# Patient Record
Sex: Female | Born: 1970 | Hispanic: No | Marital: Married | Smoking: Never smoker
Health system: Southern US, Community
[De-identification: ages and names within clinical notes are randomized; demographics above are authoritative.]

## PROBLEM LIST (undated history)

## (undated) HISTORY — PX: NO PAST SURGERIES: SHX2092

---

## 2000-08-19 ENCOUNTER — Emergency Department (HOSPITAL_COMMUNITY): Admission: EM | Admit: 2000-08-19 | Discharge: 2000-08-19 | Payer: Self-pay | Admitting: Emergency Medicine

## 2001-03-12 ENCOUNTER — Other Ambulatory Visit: Admission: RE | Admit: 2001-03-12 | Discharge: 2001-03-12 | Payer: Self-pay | Admitting: Obstetrics and Gynecology

## 2001-08-04 ENCOUNTER — Inpatient Hospital Stay (HOSPITAL_COMMUNITY): Admission: AD | Admit: 2001-08-04 | Discharge: 2001-08-06 | Payer: Self-pay | Admitting: Obstetrics and Gynecology

## 2004-04-10 ENCOUNTER — Other Ambulatory Visit: Admission: RE | Admit: 2004-04-10 | Discharge: 2004-04-10 | Payer: Self-pay | Admitting: Obstetrics and Gynecology

## 2004-07-03 ENCOUNTER — Ambulatory Visit (HOSPITAL_COMMUNITY): Admission: RE | Admit: 2004-07-03 | Discharge: 2004-07-03 | Payer: Self-pay | Admitting: Internal Medicine

## 2004-10-03 ENCOUNTER — Ambulatory Visit (HOSPITAL_COMMUNITY): Admission: RE | Admit: 2004-10-03 | Discharge: 2004-10-03 | Payer: Self-pay | Admitting: Obstetrics and Gynecology

## 2005-02-22 ENCOUNTER — Inpatient Hospital Stay (HOSPITAL_COMMUNITY): Admission: AD | Admit: 2005-02-22 | Discharge: 2005-02-25 | Payer: Self-pay | Admitting: Obstetrics and Gynecology

## 2006-04-03 ENCOUNTER — Other Ambulatory Visit: Admission: RE | Admit: 2006-04-03 | Discharge: 2006-04-03 | Payer: Self-pay | Admitting: Obstetrics and Gynecology

## 2007-12-22 ENCOUNTER — Ambulatory Visit (HOSPITAL_COMMUNITY): Admission: RE | Admit: 2007-12-22 | Discharge: 2007-12-22 | Payer: Self-pay | Admitting: Obstetrics and Gynecology

## 2009-01-06 ENCOUNTER — Ambulatory Visit (HOSPITAL_COMMUNITY): Admission: RE | Admit: 2009-01-06 | Discharge: 2009-01-06 | Payer: Self-pay | Admitting: Obstetrics and Gynecology

## 2009-01-24 ENCOUNTER — Encounter: Admission: RE | Admit: 2009-01-24 | Discharge: 2009-01-24 | Payer: Self-pay | Admitting: Obstetrics and Gynecology

## 2009-10-13 ENCOUNTER — Ambulatory Visit (HOSPITAL_BASED_OUTPATIENT_CLINIC_OR_DEPARTMENT_OTHER): Admission: RE | Admit: 2009-10-13 | Discharge: 2009-10-13 | Payer: Self-pay | Admitting: Internal Medicine

## 2009-10-13 ENCOUNTER — Ambulatory Visit: Payer: Self-pay | Admitting: Radiology

## 2010-12-09 ENCOUNTER — Encounter: Payer: Self-pay | Admitting: Obstetrics and Gynecology

## 2011-03-20 ENCOUNTER — Other Ambulatory Visit (HOSPITAL_COMMUNITY): Payer: Self-pay | Admitting: Obstetrics and Gynecology

## 2011-03-20 DIAGNOSIS — Z1231 Encounter for screening mammogram for malignant neoplasm of breast: Secondary | ICD-10-CM

## 2011-03-29 ENCOUNTER — Ambulatory Visit (HOSPITAL_COMMUNITY)
Admission: RE | Admit: 2011-03-29 | Discharge: 2011-03-29 | Disposition: A | Discharge status: Home / Self Care | Payer: 59 | Source: Ambulatory Visit | Attending: Obstetrics and Gynecology | Admitting: Obstetrics and Gynecology

## 2011-03-29 DIAGNOSIS — Z1231 Encounter for screening mammogram for malignant neoplasm of breast: Secondary | ICD-10-CM | POA: Insufficient documentation

## 2011-04-05 NOTE — H&P (Signed)
NAMESHONIA, SKILLING            ACCOUNT NO.:  [id number]   MEDICAL RECORD NO.:  [id number]          PATIENT TYPE:  INP   LOCATION:  9173                          FACILITY:  [facility]   PHYSICIAN:  Crist Fat. Rivard, M.D. DATE OF BIRTH:  Aug 09, 1971   DATE OF ADMISSION:  02/22/2005  DATE OF DISCHARGE:                                HISTORY & PHYSICAL   HISTORY OF PRESENT ILLNESS:  Ms. Leslie Kirby is a 40 year old married African  American female gravida 3, para 2-0-0-2 at 38-4/7 weeks who presents  complaining of uterine contractions since about 4 p.m. but with increased  frequency since midnight tonight.  She denies any gushes of fluid but has  seen some bloody show.  She denies any nausea, vomiting, headaches, or  visual disturbances.  Her pregnancy has been followed at [facility]  [facility] by the Certified Nurse Midwife service and has been essentially  uncomplicated though at risk for advanced maternal age, declining  amniocentesis.  She has also recently been noted to be size greater than  dates and had an ultrasound scheduled for later this week but has now  progressed in labor prior to that time.   PAST OB/GYN HISTORY:  She is a gravida 3, para 2-0-0-2 with an LMP of May 29, 2004, given her an [facility] of March 04, 2005, confirmed by ultrasound.  She  has delivered a viable female infant in October of 1996 who weighed 8 pounds  at [redacted] weeks gestation following a 12-hour labor.  She was delivered in  __________ with no complications.  That child's name is Pieter Partridge.  In  September of 2002, she delivered a viable female infant who weighed 8 pounds 6  ounces at [redacted] weeks gestation following a 14-hour labor with an epidural.  That child's name is Clovis Riley and was delivered at [facility] attended  by the [facility].   ALLERGIES:  NO KNOWN DRUG ALLERGIES.   PAST MEDICAL HISTORY:  She reports having the usual childhood diseases.  She  has no medical problems, and her only  hospitalization has been for  childbirth.   FAMILY HISTORY:  Noncontributory, except for maternal grandmother deceased  from MI.  Genetic history is negative except for the fact that the patient  is over age 70 and declined amniocentesis.   SOCIAL HISTORY:  She is married to [place]ley who is involved and supportive.  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  He used to be  employed at [employer] food services.  He is now employed at [employer] endoscopy.  They are of the [place] faith.  They deny any  elicit drug use, alcohol, or smoking with this pregnancy.   PRENATAL LABORATORY DATA:  Blood type O positive, antibody screen negative,  sickle cell trait negative, syphilis nonreactive, rubella positive,  hepatitis B surface antigen negative, GC and Chlamydia negative, Pap within  normal limits.  Her one-hour Glucola was 120.  Her 36-week beta strep was  negative.   PHYSICAL EXAMINATION:  VITAL SIGNS:  Her vital signs were stable.  She is  afebrile.  HEENT:  Grossly within normal limits.  HEART:  Regular rate and rhythm.  CHEST:  Clear.  BREASTS:  Soft and nontender.  ABDOMEN:  Gravid with uterine contractions every four to six minutes.  Her  fetal heart rate is reactive and reassuring.  PELVIC:  4 cm, 90%, vertex at -2, with intact membranes.  EXTREMITIES:  Within normal limits.   ASSESSMENT:  1.  Intrauterine pregnancy at term.  2.  Early active labor.  3.  Negative group B strep.   PLAN:  Admit to labor and delivery to follow routine CNM orders and to  notify Dr. Estanislado Pandy of the patient's admission.      SJD/MEDQ  D:  02/22/2005  T:  02/22/2005  Job:  [id number]

## 2011-04-05 NOTE — H&P (Signed)
[facility]  Patient:    Leslie Kirby, Leslie Kirby Visit Number: [id number] MRN: [id number]          Service Type: OBS Location: 910B 9166 [id number] Attending Physician:  Leonard Schwartz Dictated by:   Marcelle Smiling Clelia Croft, C.N.M. Admit Date:  08/04/2001                           History and Physical  DATE OF BIRTH:                1971-01-26.  HISTORY OF PRESENT ILLNESS:   Leslie Kirby is a 40 year old, gravida 2, para 1-0-0-1, married black female at 38-4/7 weeks who presents with regular uterine contractions since 3 p.m. She denies leaking, bleeding, headache, or visual disturbances, although she has had bloody show this evening. Her pregnancy has been followed by the [facility] Certified Nurse Midwife service and has been remarkable for (1) late to care at 18 weeks, and (2) group B strep negative.  PRENATAL LABORATORY DATA:     Her prenatal labs were collected on March 12, 2001 with hemoglobin 11.4, hematocrit 36.4, platelets 275,000. Blood type O positive, antibody negative, sickle cell trait negative, RPR nonreactive, rubella immune, hepatitis B surface antigen negative. Pap smear within normal limits. On May 03, 2001 her one-hour glucola was 83 and her hemoglobin was 13. On July 21, 2001 culture of the vaginal tract for group B strep was negative.  HISTORY OF PRESENT PREGNANCY: She presented for care at approximately [redacted] weeks gestation. Pregnancy ultrasonography showed growth consistent with dating by last menstrual period. She had some sciatic pain at approximately 26 weeks which lasted approximately three weeks. The rest of her prenatal care was unremarkable.  OBSTETRIC HISTORY:            She is a gravida 2, para 1-0-0-1. In October of 1996 she vaginally delivered a female infant in [place] after five hours in labor at term. Infant was approximately 7 pounds. The present pregnancy is her second pregnancy.  PAST MEDICAL  HISTORY:  ALLERGIES:                     No known drug allergies.  MEDICAL HISTORY:              The patient reports having had the usual childhood illnesses. She has used Depo-Provera and oral contraceptives in the past for birth control.  FAMILY HISTORY:               Significant for maternal grandmother being deceased.  GENETIC HISTORY:              Negative.  SOCIAL HISTORY:               She is married to [place] who is involved and supportive. They are of the [place] faith and both are employed. They deny any alcohol, tobacco, or illicit drug use with the pregnancy.  OBJECTIVE DATA:  VITAL SIGNS:                  Vitals signs are stable. She is afebrile.  HEENT:                        Within normal limits.  CHEST:                        Clear to auscultation.  HEART:                        Regular to rate and rhythm.  ABDOMEN:                      Gravid in contour with fundal height approximately 38 cm above the pubic symphysis. Uterine contractions are approximately every three to five minutes and they are strong.  ELECTRONIC FETAL MONITOR:     Fetal heart rate is reassuring with positive accelerations with no decelerations.  PELVIC:                       Cervix is 3-4 cm, 100%, vertex, -2 with a bulging bag of water.  EXTREMITIES:                  Within normal limits.  ASSESSMENT:                   1. Intrauterine pregnancy at term.                               2. Early active labor.   PLAN:                         1. Admit to birthing suite per consult with                                  Dr. Stefano Gaul.                               2. Routine CNM orders.                               3. Plans IV pain medications for relief.                               4. Anticipate normal spontaneous vaginal birth. Dictated by:   Marcelle Smiling Clelia Croft, C.N.M. Attending Physician:  Leonard Schwartz DD:  08/04/01 TD:  08/04/01 Job: [id number]

## 2011-04-05 NOTE — Discharge Summary (Signed)
NAMETANVEER, DOBBERSTEIN            ACCOUNT NO.:  [id number]   MEDICAL RECORD NO.:  [id number]          PATIENT TYPE:  INP   LOCATION:  9141                          [facility]ILITY:  [facility]   PHYSICIAN:  Janine Limbo, M.D.DATE OF BIRTH:  [birth year]   DATE OF ADMISSION:  02/22/2005  DATE OF DISCHARGE:  02/25/2005                                 DISCHARGE SUMMARY   ADMISSION DIAGNOSES:  1.  Intrauterine pregnancy at term.  2.  Advanced maternal age.  3.  Early active labor.  4.  Negative group B Strep.   DISCHARGE DIAGNOSES:  1.  Intrauterine pregnancy at term.  2.  Advanced maternal age.  3.  Early active labor.  4.  Negative group B Strep.  5.  Failure to progress in labor.  6.  Persistent occiput posterior presentation.  7.  Status post primary low transverse cesarean section.  8.  Breast feeding.  9.  Planning vasectomy for contraception.   PROCEDURES:  Primary low transverse cesarean section for delivery of a  viable female infant who weighed 8 pounds 6 ounces and had Apgars of 8 and 9.  Attended in delivery by Dr. Hal Morales and Renaldo Reel. Emilee Hero, C.N.M.   HOSPITAL COURSE:  Ms. Long is a 40 year old married African female gravida  3, para 2-0-0-2 at 38-4/7 weeks who presented complaining of uterine  contractions and was, on admission, 4 cm, 90%, vertex -2 with intact  membranes.  She progressed in labor to approximately 8 cm prior to  artificial rupture of membranes whereupon the vertex did descend well  against the cervix, however, she failed to dilate any further and actually  regressed to about 6 to 7 cm even with greater than 250 MVU for more than  two hours and at that point was recommended to proceed with the cesarean  section for failure to progress.  The patient and husband agreed and  underwent the same for delivery of a viable female infant who had Apgars of 8  and 9 and weight 8 pounds 6 ounces and was noted to be in a persistent OP  presentation at delivery.   She was attended in delivery by Dr. Hal Morales and Renaldo Reel. Emilee Hero.  Please see operative note for further details.   Postoperatively, she has done well.  She is ambulating, voiding and eating  without difficulty.  Her vital signs are stable and she has been afebrile  throughout her postoperative stay.  She is breastfeeding without difficulty.  She is deemed ready for discharge today.   DISCHARGE INSTRUCTIONS:  Her discharge instructions are as per the [facility] handout.   DISCHARGE MEDICATIONS:  1.  Motrin 600 mg p.o. q.6h. p.r.n. for pain.  2.  Tylox one to two p.o. q.4-6h. p.r.n. for pain.   DISCHARGE LABORATORY DATA:  Hemoglobin is 8.9, wbc count is 21.4 and her  platelets are 208.   FOLLOW UP:  Her discharge follow-up will be in four to six weeks at [facility] or p.r.n.   DISCHARGE STATUS:  Well and stable.  SJD/MEDQ  D:  02/25/2005  T:  02/25/2005  Job:  [id number]

## 2011-04-05 NOTE — Op Note (Signed)
Leslie Kirby, Leslie Kirby            ACCOUNT NO.:  [id number]   MEDICAL RECORD NO.:  [id number]          PATIENT TYPE:  INP   LOCATION:  9141                          [facility]  [facility]   PHYSICIAN:  Hal Morales, M.D.DATE OF BIRTH:  [birth year]   DATE OF PROCEDURE:  02/22/2005  DATE OF DISCHARGE:                                 OPERATIVE REPORT   PREOPERATIVE DIAGNOSES:  1.  Intrauterine pregnancy at term.  2.  Failure to progress in labor.   POSTOPERATIVE DIAGNOSIS:  1.  Intrauterine pregnancy at term.  2.  Failure to progress in labor.  3.  Occiput posterior.   OPERATION:  Primary low transverse cesarean section.   SURGEON:  Vanessa P. Pennie Rushing, M.D.   FIRST ASSISTANT:  Erin Sons, certified nurse midwife.   ANESTHESIA:  Epidural.   ESTIMATED BLOOD LOSS:  1200 mL.   COMPLICATIONS:  Poor Foley drainage during the procedure.   FINDINGS:  The patient was delivered of a female infant weighing 8 pounds 6  ounces with Apgars of 8 and 9 at one and five minutes respectively. The  uterus, tubes and ovaries were normal for the gravid state.   PROCEDURE:  A discussion was held with the patient and her husband once her  cervical examination was noted to be unchanged after approximately 4 hours  of [place] units in the 220-280 range. In light of failure to progress,  the recommendation was made with cesarean section. The risks of anesthesia,  bleeding, infection and damage to adjacent organs was explained and the  patient acknowledged understanding of these risks. The patient was thus  taken to the operating room after appropriate identification and placed on  the operating table. Her Foley catheter and labor epidural were in place.  She was placed in the supine position with a left lateral tilt as the labor  epidural was dosed for surgical anesthesia. The abdomen was prepped with  multiple layers of Betadine and draped as a sterile field. The suprapubic  region was infiltrated  with 20 mL  of 0.25% Marcaine once adequate  anesthesia was documented. A transverse incision was made in the abdomen  suprapubically in the abdomen opened in layers. The peritoneum was entered  and the bladder blade placed. The uterus was incised approximately 2 cm  above the uterovesical fold and that incision taken laterally on either side  bluntly. The infant was delivered from the occiput posterior position and  after having the nares and pharynx suctioned and the cord clamped and cut  was handed off to the awaiting pediatricians. The appropriate cord blood was  drawn and the placenta noted to have separated from the uterus and was  removed from the operative field. The uterine incision was closed with a  running interlocking suture of zero Vicryl. An imbricating suture of zero  Vicryl was placed. Bovie cautery allowed for adequate hemostasis. Copious  irrigation was carried out. The abdominal peritoneum was closed with a  running suture of 2-0 Vicryl. The rectus muscles were reapproximated in the  midline with figure-of-eight suture of zero Vicryl. The rectus fascia was  closed with  a running suture of zero Vicryl and then reinforced on either  side of midline with figure-of-eight sutures of zero Vicryl. The  subcutaneous tissue was irrigated and made hemostatic with Bovie cautery.  Skin staples were applied to the skin incision. A sterile dressing was  applied. The patient was taken from the operating room to the recovery room  in satisfactory condition having tolerated the procedure well with sponge  and instrument counts correct. The infant went to the full-term nursery. The  Foley catheter was noted to drain well once she was in the postoperative  anesthesia care unit.      VPH/MEDQ  D:  02/22/2005  T:  02/23/2005  Job:  [id number]

## 2012-03-19 ENCOUNTER — Ambulatory Visit: Payer: Self-pay | Admitting: Obstetrics and Gynecology

## 2014-10-21 ENCOUNTER — Ambulatory Visit (INDEPENDENT_AMBULATORY_CARE_PROVIDER_SITE_OTHER): Payer: 59 | Admitting: Diagnostic Neuroimaging

## 2014-10-21 ENCOUNTER — Encounter: Payer: Self-pay | Admitting: Diagnostic Neuroimaging

## 2014-10-21 VITALS — BP 128/77 | HR 68 | Temp 98.2°F | Ht 64.0 in | Wt 216.2 lb

## 2014-10-21 DIAGNOSIS — H538 Other visual disturbances: Secondary | ICD-10-CM

## 2014-10-21 DIAGNOSIS — G441 Vascular headache, not elsewhere classified: Secondary | ICD-10-CM

## 2014-10-21 DIAGNOSIS — G43009 Migraine without aura, not intractable, without status migrainosus: Secondary | ICD-10-CM

## 2014-10-21 NOTE — Patient Instructions (Signed)
I will check MRI brain.  

## 2014-10-21 NOTE — Progress Notes (Signed)
[facility]  PATIENT: Leslie Kirby DOB: 08/04/1971  REFERRING CLINICIAN: Osei-Bonsu HISTORY FROM: patient  REASON FOR VISIT: new consult    HISTORICAL  CHIEF COMPLAINT:  Chief Complaint  Patient presents with  . Headache    HISTORY OF PRESENT ILLNESS:   43 year old right-handed female here for evaluation of headaches. For past 1-2 years patient has had intermittent headaches, right-sided, throbbing, pounding headaches without nausea vomiting photophobia or phonophobia. Patient usually takes Advil and goes to sleep. No specific triggering factors. Patient has 2-3 days of headache per month. No change in sleep, stress, diet, exercise, hormonal association. Patient's mother has some headaches but not diagnosed as migraine. Patient never had these kind of headaches in the past.   REVIEW OF SYSTEMS: Full 14 system review of systems performed and notable only for headache.  ALLERGIES: No Known Allergies  HOME MEDICATIONS: No outpatient prescriptions prior to visit.   No facility-administered medications prior to visit.    PAST MEDICAL HISTORY: History reviewed. No pertinent past medical history.  PAST SURGICAL HISTORY: Past Surgical History  Procedure Laterality Date  . No past surgeries      FAMILY HISTORY: Family History  Problem Relation Age of Onset  . Memory loss Mother     SOCIAL HISTORY:  History   Social History  . Marital Status: Married    Spouse Name: Mikeal Hawthorne    Number of Children: 3  . Years of Education: college   Occupational History  .  Other    [place]   Social History Main Topics  . Smoking status: Never Smoker   . Smokeless tobacco: Not on file  . Alcohol Use: No  . Drug Use: No  . Sexual Activity: Not on file   Other Topics Concern  . Not on file   Social History Narrative   Patient lives at home with family.   Caffeine Use: none     PHYSICAL EXAM  Filed Vitals:   10/21/14 1005  BP: 128/77    Pulse: 68  Temp: 98.2 F (36.8 C)  TempSrc: Oral  Height: 5\' 4"  (1.626 m)  Weight: 216 lb 3.2 oz (98.068 kg)    Body mass index is 37.09 kg/(m^2).   Visual Acuity Screening   Right eye Left eye Both eyes  Without correction:     With correction: 20/30 20/40     No flowsheet data found.  GENERAL EXAM: Patient is in no distress; well developed, nourished and groomed; neck is supple  CARDIOVASCULAR: Regular rate and rhythm, no murmurs, no carotid bruits  NEUROLOGIC: MENTAL STATUS: awake, alert, oriented to person, place and time, recent and remote memory intact, normal attention and concentration, language fluent, comprehension intact, naming intact, fund of knowledge appropriate CRANIAL NERVE: no papilledema on fundoscopic exam, pupils equal and reactive to light, visual fields full to confrontation, extraocular muscles intact, no nystagmus, facial sensation and strength symmetric, hearing intact, palate elevates symmetrically, uvula midline, shoulder shrug symmetric, tongue midline. MOTOR: normal bulk and tone, full strength in the BUE, BLE SENSORY: normal and symmetric to light touch, pinprick, temperature, vibration  COORDINATION: finger-nose-finger, fine finger movements normal REFLEXES: deep tendon reflexes present and symmetric GAIT/STATION: narrow based gait; able to walk on toes, heels and tandem; romberg is negative    DIAGNOSTIC DATA (LABS, IMAGING, TESTING) - I reviewed patient records, labs, notes, testing and imaging myself where available.  No results found for: WBC, HGB, HCT, MCV, PLT No results found for: NA, K, CL, CO2,  GLUCOSE, BUN, CREATININE, CALCIUM, PROT, ALBUMIN, AST, ALT, ALKPHOS, BILITOT, GFRNONAA, GFRAA No results found for: CHOL, HDL, LDLCALC, LDLDIRECT, TRIG, CHOLHDL No results found for: HGBA1C No results found for: VITAMINB12 No results found for: TSH      ASSESSMENT AND PLAN  43 y.o. year old female here with new onset headaches since  past 1-2 years (~2013). Some migraine / vascular features. However no prior history of headaches. No triggering factors.   PLAN: - MRI brain (with and without) to rule out secondary causes of HA - continue ibuprofen prn - no need for migraine preventative at this time - focus on good nutrition, exercise, sleep and stress mgmt  Orders Placed This Encounter  Procedures  . MR Brain W Wo Contrast   Return in about 3 months (around 01/20/2015).    [name], MD 63/01/3544, 62:56 AM Certified in Neurology, Neurophysiology and Neuroimaging  [facility] [address], [place], [place] [address] [phone]

## 2014-10-31 ENCOUNTER — Ambulatory Visit: Payer: 59 | Attending: Internal Medicine | Admitting: Physical Therapy

## 2014-10-31 DIAGNOSIS — M545 Low back pain, unspecified: Secondary | ICD-10-CM

## 2014-10-31 NOTE — Therapy (Signed)
Outpatient Rehabilitation [facility] [address] [place], [place], [address] Phone: [phone]   Fax:  [phone]  Physical Therapy Evaluation  Patient Details  Name: Leslie Kirby MRN: [id number] Date of Birth: [birth year]  Encounter Date: 10/31/2014      PT End of Session - 10/31/14 1623    Visit Number 1   Number of Visits 12   Date for PT Re-Evaluation 12/30/14   PT Start Time 1548   PT Stop Time 1629   PT Time Calculation (min) 41 min   Activity Tolerance Patient tolerated treatment well   Behavior During Therapy [facility] for tasks assessed/performed      No past medical history on file.  Past Surgical History  Procedure Laterality Date  . No past surgeries      There were no vitals taken for this visit.  Visit Diagnosis:  Right-sided low back pain without sciatica - Plan: PT plan of care cert/re-cert      Subjective Assessment - 10/31/14 1553    Symptoms Pt is a 43 y/o female who presents to OPPT for constant back pain x 1 year.  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   Limitations Sitting;Standing   How long can you sit comfortably? 1 hour   How long can you stand comfortably? 30-45 min   How long can you walk comfortably? unlimited   Diagnostic tests n/a   Patient Stated Goals decrease pain, no pain with ADLs and work   Currently in Pain? Yes   Pain Score 8    Pain Location Back   Pain Orientation Mid;Lower;Right   Pain Descriptors / Indicators Dull;Aching   Pain Type Chronic pain   Pain Onset More than a month ago   Pain Frequency Constant   Aggravating Factors  work responsibilities (pulling/pushing food carts)   Pain Relieving Factors advil   Effect of Pain on Daily Activities difficulty with work responsibilities          [facility] PT Assessment - 10/31/14 1557    Assessment   Medical Diagnosis low back pain   Onset Date 10/31/13  approximate date   Next MD  Visit PRN   Prior Therapy n/a   Precautions   Precautions None   Restrictions   Weight Bearing Restrictions No   Balance Screen   Has the patient fallen in the past 6 months No   Has the patient had a decrease in activity level because of a fear of falling?  No   Is the patient reluctant to leave their home because of a fear of falling?  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   Leisure dancing   Cognition   Overall Cognitive Status Within Functional Limits for tasks assessed   Observation/Other Assessments   Observations pt tender to palpation R paraspinals and R quadratus lumborum   Focus on Therapeutic Outcomes (FOTO)  FOTO 37 (63% limited; predicted 31% limited)  Posture/Postural Control   Posture/Postural Control Postural limitations   Postural Limitations Rounded Shoulders;Forward head;Increased lumbar lordosis   AROM   Lumbar Flexion 67   Lumbar Extension 37   Lumbar - Right Side Bend 29   Lumbar - Left Side Bend 20   Strength   Right Hip Flexion 4/5   Right Hip Extension 4/5   Right Hip ABduction 3+/5   Left Hip Flexion 4/5   Left Hip Extension 3+/5   Left Hip ABduction 3+/5   Right Knee Flexion 4/5   Right Knee Extension 5/5   Left Knee Flexion 4/5   Left Knee Extension 5/5   Right Ankle Dorsiflexion 5/5   Left Ankle Dorsiflexion 5/5   Special Tests    Special Tests Sacrolliac Tests;Lumbar  SLR negative bil   Sacroiliac Tests  Pelvic  Compression   Pelvic Dictraction   Findings Negative   Pelvic Compression   Findings Negative          [facility] Adult PT Treatment/Exercise - 10/31/14 1557    Modalities   Modalities Electrical Stimulation;Moist Heat   Moist Heat Therapy   Number Minutes Moist Heat 12 Minutes   Moist Heat Location Other (comment)  lumbar spine   Electrical Stimulation   Electrical Stimulation Location R low back   Electrical Stimulation Action IFC   Electrical Stimulation Parameters to tolerance   Electrical Stimulation Goals Pain          PT Education - 10/31/14 1622    Education provided Yes   Education Details clinical findings, muscle tightness   Person(s) Educated Patient   Methods Explanation   Comprehension Verbalized understanding            PT Long Term Goals - 10/31/14 1626    PT LONG TERM GOAL #1   Title independent with HEP (12/12/14)   Time 6   Period Weeks   Status New   PT LONG TERM GOAL #2   Title perform active LROM without pain (12/12/14)   Time 6   Period Weeks   Status New   PT LONG TERM GOAL #3   Title report ability to work full shift without increase in pain (12/12/14)   Time 6   Period Weeks   Status New   PT LONG TERM GOAL #4   Title improve hip strength to at least 4+/5 flexion/ext/abdct for improved hip stability (12/12/14)   Time 6   Period Weeks   Status New          Plan - 10/31/14 1624    Clinical Impression Statement Pt presents to OPPT with tight paraspinal and quadratus lumborum on R side.  Pt with postural limitations and poor mechanics affecting pain.  Will benefit from PT to maximize functional mobility and perform activities with decreased pain.   Pt will benefit from skilled therapeutic intervention in order to improve on the following deficits Impaired flexibility;Decreased range of motion;Decreased strength;Postural dysfunction;Improper body mechanics;Decreased Sadeen   Rehab Potential Good   PT Frequency 2x / week   PT  Duration 6 weeks   PT Treatment/Interventions ADLs/Self Care Home Management;Cryotherapy;Electrical Stimulation;Functional mobility training;Ultrasound;Manual techniques;Dry needling;Passive range of motion;Therapeutic exercise;Traction;Moist Heat;Therapeutic activities;Patient/family education   PT Next Visit Plan HEP for stretching, hip/core strengthening   Consulted and Agree with Plan of Care Patient                              Problem List There are  no active problems to display for this patient.    Laureen Abrahams, PT, DPT 10/31/2014 5:33 PM  [facility] [address], [place] [address]  [phone] (office) [phone] (fax)

## 2014-11-08 ENCOUNTER — Ambulatory Visit: Payer: 59 | Admitting: Physical Therapy

## 2014-11-08 DIAGNOSIS — M545 Low back pain, unspecified: Secondary | ICD-10-CM

## 2014-11-08 NOTE — Therapy (Signed)
[place], [place], [address] Phone: [phone]   Fax:  [phone]  Physical Therapy Treatment  Patient Details  Name: Leslie Kirby MRN: [id number] Date of Birth: July 17, 1971  Encounter Date: 11/08/2014      PT End of Session - 11/08/14 1751    Visit Number 2   Number of Visits 12   Date for PT Re-Evaluation 12/30/14   PT Start Time 8185   PT Stop Time 6314   PT Time Calculation (min) 59 min   Activity Tolerance Patient tolerated treatment well;Patient limited by pain      No past medical history on file.  Past Surgical History  Procedure Laterality Date  . No past surgeries      There were no vitals taken for this visit.  Visit Diagnosis:  Right-sided low back pain without sciatica      Subjective Assessment - 11/08/14 1550    Symptoms same back into Rt side, 7.5/10, shoulder rt 8/10   Pain Score 8    Pain Location Back   Pain Orientation Mid;Lower;Right   Pain Descriptors / Indicators --  pain   Pain Type Chronic pain   Pain Onset More than a month ago   Pain Frequency Constant   Aggravating Factors  bending back and to the right side   Pain Relieving Factors advil, relaxing   Effect of Pain on Daily Activities moving around   Multiple Pain Sites Yes   Pain Score 8   Pain Location Shoulder   Pain Orientation Right   Pain Descriptors / Indicators --  pain   Pain Frequency Constant                    [facility] Adult PT Treatment/Exercise - 11/08/14 1600    Lumbar Exercises: Stretches   Active Hamstring Stretch 3 reps;30 seconds  added to home exercise program   Single Knee to Chest Stretch 5 reps  5 second holds   Lower Trunk Rotation 3 reps;10 seconds   Standing Extension 1 rep  painful end range, very mobile   Lumbar Exercises: Standing   Wall Slides 5 reps;5 seconds  added to home exercise   Lumbar Exercises: Supine   Ab Set 10 reps  needs cues    Clam 10 reps  observed for neutral spine   Knee/Hip Exercises: Stretches   Gastroc Stretch 3 reps;30 seconds  added to home exercise   Moist Heat Therapy   Number Minutes Moist Heat 20 Minutes   Moist Heat Location --  back   Electrical Stimulation   Electrical Stimulation Location --  Rt low back   Electrical Stimulation Action IFC   Electrical Stimulation Parameters --  6   Electrical Stimulation Goals Pain                PT Education - 11/08/14 1622    Education provided Yes   Education Details back exercises basic   Person(s) Educated Patient   Methods Explanation;Demonstration;Tactile cues;Handout   Comprehension Verbalized understanding;Returned demonstration             PT Long Term Goals - 11/08/14 1754    PT LONG TERM GOAL #1   Title independent with HEP (12/12/14)   Time 6   Period Weeks   Status On-going   PT LONG TERM GOAL #2   Title perform active LROM without pain (12/12/14)   Time 6   Period Weeks   Status On-going  PT LONG TERM GOAL #3   Title report ability to work full shift without increase in pain (12/12/14)   Time 6   Period Weeks   Status On-going   PT LONG TERM GOAL #4   Title improve hip strength to at least 4+/5 flexion/ext/abdct for improved hip stability (12/12/14)   Time 6   Period Weeks   Status Unable to assess               Plan - 11/08/14 1752    Clinical Impression Statement able to begin home exercise program, so progress toward that goal.  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   PT Next Visit Plan review exercises, try cat camel, modalities, quadriped strengthening, treadmill.   Consulted and Agree with Plan of Care Patient        Problem List There are no active problems to display for this patient. Melvenia Needles, PTA 11/08/2014 5:56 PM Phone: [phone] Fax: [phone]   [facility] 11/08/2014, 5:56 PM  [place], [place], [address] Phone: [phone]   Fax:  [phone]

## 2014-11-08 NOTE — Patient Instructions (Signed)
Bridge   Lie back, legs bent. Inhale, pressing hips up. Keeping ribs in, lengthen lower back. Exhale, rolling down along spine from top. Repeat __10_ times. Do ___1_ sessions per day.  Copyright  VHI. All rights reserved.   Pelvic Tilt   Flatten back by tightening stomach muscles and buttocks. Repeat ____ times per set. Do ____ sets per session. Do ____ sessions per day.  http://orth.exer.us/134   Copyright  VHI. All rights reserved. Knee to Chest (Flexion)   Pull knee toward chest. Feel stretch in lower back or buttock area. Breathing deeply, Hold __5 to 10__ seconds. Repeat with other knee. Repeat _5 to 10___ times. Do 1 to 2____ sessions per day.  http://gt2.exer.us/225   Copyright  VHI. All rights reserved.   Lower Trunk Rotation Stretch   Keeping back flat and feet together, rotate knees to left side. Hold _15 seconds, 1 to 2 times per day.  http://orth.exer.us/122   Copyright  VHI. All rights reserved.  Supine: Leg Stretch With Strap (Basic)   Lie on back with one knee bent, foot flat on floor. Hook strap around other foot. Straighten knee. Keep knee level with other knee. Hold _30__ seconds. Relax leg completely down to floor.  Repeat __30_ times per session. Do __1_ sessions per day.  Copyright  VHI. All rights reserved.  Standing Arch (Extension)      http://gt2.exer.us/247   Copyright  VHI. All rights reserved.  Straight Leg Calf Stretch (Gastroc)   Put palms against wall, one leg forward and bent. With other leg back straight and heel flat on floor, lean into wall. Hold ___30_ seconds. Change legs and repeat. Repeat ____3imes. Do _1___ sessions per day.  http://gt2.exer.us/419   Copyright  VHI. All rights reserved.   Back Wall Slide   With feet ___25_ inches from wall, lean as much of back against the wall as possible. Gently squat down 6-10_ inches, keeping back against wall. Hold 10____ seconds while counting out loud. Repeat _10_  times. Do __1__ sessions per day.  http://gt2.exer.us/563   Copyright  VHI. All rights reserved.

## 2014-11-15 ENCOUNTER — Ambulatory Visit: Payer: 59 | Admitting: Rehabilitation

## 2014-11-16 ENCOUNTER — Telehealth: Payer: Self-pay | Admitting: *Deleted

## 2014-11-16 ENCOUNTER — Ambulatory Visit: Payer: 59 | Admitting: Rehabilitation

## 2014-11-16 DIAGNOSIS — M545 Low back pain, unspecified: Secondary | ICD-10-CM

## 2014-11-16 NOTE — Therapy (Signed)
[place], [place], [address] Phone: [phone]   Fax:  [phone]  Physical Therapy Treatment  Patient Details  Name: Leslie Kirby MRN: [id number] Date of Birth: 02/17/1971  Encounter Date: 11/16/2014      PT End of Session - 11/16/14 1625    Visit Number 3   Number of Visits 12   Date for PT Re-Evaluation 12/30/14   PT Start Time 0338   PT Stop Time 0431   PT Time Calculation (min) 53 min      No past medical history on file.  Past Surgical History  Procedure Laterality Date  . No past surgeries      There were no vitals taken for this visit.  Visit Diagnosis:  No diagnosis found.      Subjective Assessment - 11/16/14 1540    Symptoms I fell one time, backwards, no injury. 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 1.5 hours   How long can you stand comfortably? no problem   Patient Stated Goals decrease pain, no pain with ADLs and work   Aggravating Factors  bending to right side   Pain Relieving Factors advil, heat compress          [facility] Adult PT Treatment/Exercise - 11/16/14 1550    Lumbar Exercises: Stretches   Active Hamstring Stretch 3 reps;30 seconds  with strap   Single Knee to Chest Stretch 3 reps;30 seconds   Lower Trunk Rotation 3 reps;10 seconds   Lumbar Exercises: Standing   Wall Slides 5 seconds;10 reps   Lumbar Exercises: Supine   Ab Set 10 reps   Clam 10 reps   Bent Knee Raise 10 reps   Bridge 10 reps   Lumbar Exercises: Sidelying   Other Sidelying Lumbar Exercises QL stretch over pillow roll right x 2 min   Lumbar Exercises: Quadruped   Madcat/Old Horse 10 reps   Other Quadruped Lumbar Exercises 3 x 30 sec childs pose   Other Quadruped Lumbar Exercises QL stretch on all fours x 3 each way 10 sec   Knee/Hip  Exercises: Stretches   Gastroc Stretch 3 reps;30 seconds   Modalities   Modalities Electrical Stimulation   Moist Heat Therapy   Number Minutes Moist Heat 15 Minutes   Moist Heat Location --  back   Electrical Stimulation   Electrical Stimulation Location --  Rt low back   Electrical Stimulation Action IFC   Electrical Stimulation Parameters to tolerance   Electrical Stimulation Goals Pain                PT Education - 11/16/14 1624    Education provided Yes   Education Details QL Stretch Side Lying oover Bolster, cat/camel   Person(s) Educated Patient   Methods Explanation;Handout   Comprehension Verbalized understanding             PT Long Term Goals - 11/08/14 1754    PT LONG TERM GOAL #1   Title independent with HEP (12/12/14)   Time 6   Period Weeks   Status On-going   PT LONG TERM GOAL #2   Title perform active LROM without pain (12/12/14)   Time 6   Period Weeks   Status On-going   PT LONG TERM GOAL #3   Title report ability to work  full shift without increase in pain (12/12/14)   Time 6   Period Weeks   Status On-going   PT LONG TERM GOAL #4   Title improve hip strength to at least 4+/5 flexion/ext/abdct for improved hip stability (12/12/14)   Time 6   Period Weeks   Status Unable to assess               Plan - 11/16/14 1626    Clinical Impression Statement Independent with initial HEP, good core control with prepilates exercises   PT Next Visit Plan begin hip abduction strengthening, review QL stretch and cat camel, ? quadruped strengthening, ?Treadmill        Problem List There are no active problems to display for this patient.   Dorene Ar, [place] 11/16/2014, 4:30 PM  [place], [place], [address] Phone: [phone]   Fax:  [phone]

## 2014-11-16 NOTE — Patient Instructions (Addendum)
Thoracolumbar Side-Bend: Bolster (Side-Lying)  DROP TOP LEG OFF END OF BEND Lie on left side, bolster at hip crest level.  top arm slack above head. Hold _2___ minutes Repeat __1__ times per set. Do __1__ sets per session. Do __1__ sessions per day.  http://orth.exer.us/1015   Copyright  VHI. All rights reserve Angry Cat, All Fours   Kneel on hands and knees. Tuck chin and tighten stomach. Exhale and round back upward. Inhale and arch back downward. Hold each position __10_ seconds. Repeat __10_ times per session. Do 2___ sessions per day.  Copyright  VHI. All rights reserved.

## 2014-11-16 NOTE — Telephone Encounter (Signed)
appts made and printed...td 

## 2014-11-21 ENCOUNTER — Other Ambulatory Visit: Payer: Commercial Managed Care - PPO

## 2014-11-22 ENCOUNTER — Ambulatory Visit: Payer: 59 | Attending: Internal Medicine | Admitting: Physical Therapy

## 2014-11-22 DIAGNOSIS — M545 Low back pain, unspecified: Secondary | ICD-10-CM

## 2014-11-22 NOTE — Therapy (Signed)
[place], [place], [address] Phone: [phone]   Fax:  [phone]  Physical Therapy Treatment  Patient Details  Name: Leslie Kirby [place]BOURN MRN: [id number] Date of Birth: [birth year]  Encounter Date: 11/22/2014      PT End of Session - 11/22/14 1555    Visit Number 4   Number of Visits 12   Date for PT Re-Evaluation 12/30/14   PT Start Time 1520   PT Stop Time 1607   PT Time Calculation (min) 47 min   Activity Tolerance Patient tolerated treatment well   Behavior During Therapy [facility] for tasks assessed/performed      No past medical history on file.  Past Surgical History  Procedure Laterality Date  . No past surgeries      There were no vitals taken for this visit.  Visit Diagnosis:  Right-sided low back pain without sciatica      Subjective Assessment - 11/22/14 1523    Symptoms Pain still present, "not like it used to be."  Feels like pain is improving.   Limitations Sitting   How long can you sit comfortably? 2 hours   How long can you stand comfortably? no problem   How long can you walk comfortably? unlimited   Patient Stated Goals decrease pain, no pain with ADLs and work   Currently in Pain? Yes   Pain Score 5    Pain Location Back   Pain Orientation Right;Mid;Lower   Pain Type Chronic pain   Pain Onset More than a month ago   Pain Frequency Constant   Aggravating Factors  bending to right side   Pain Relieving Factors advil, heat   Multiple Pain Sites No                    [facility] Adult PT Treatment/Exercise - 11/22/14 1537    Lumbar Exercises: Aerobic   Stationary Bike NuStep Level 6 x 8 min, 4 extremities   Lumbar Exercises: Supine   Straight Leg Raise 10 reps;3 seconds   Lumbar Exercises: Sidelying   Clam 10 reps;2 seconds   Other Sidelying Lumbar Exercises QL stretch over pillow roll right x 2 min   Lumbar Exercises: Quadruped   Madcat/Old Horse 10 reps   Other Quadruped Lumbar Exercises QL stretch on all fours x 3 each way 10 sec   Modalities   Modalities Electrical Stimulation;Moist Heat   Moist Heat Therapy   Number Minutes Moist Heat 15 Minutes   Moist Heat Location Other (comment)  mid/low back   Electrical Stimulation   Electrical Stimulation Location R low back   Electrical Stimulation Action IFC   Electrical Stimulation Parameters to tolerance   Electrical Stimulation Goals Pain                     PT Long Term Goals - 11/22/14 1556    PT LONG TERM GOAL #1   Title independent with HEP (12/12/14)   Time 6   Period Weeks   Status On-going   PT LONG TERM GOAL #2   Title perform active LROM without pain (12/12/14)   Time 6   Period Weeks   Status On-going   PT LONG TERM GOAL #3   Title report ability to work full shift without increase in pain (12/12/14)   Time 6   Period Weeks   Status On-going   PT LONG TERM GOAL #4   Title improve hip strength to at  least 4+/5 flexion/ext/abdct for improved hip stability (12/12/14)   Time 6   Period Weeks   Status On-going               Plan - 11/22/14 1555    Clinical Impression Statement Pt with improved pain and compliant with HEP.  Will continue to benefit from PT to maximize functional mobility and decrease pain.   PT Next Visit Plan cont hip abdct strengthening, quadruped exercises, treadmill   Consulted and Agree with Plan of Care Patient        Problem List There are no active problems to display for this patient.  Laureen Abrahams, PT, DPT 11/22/2014 4:10 PM  [facility] [address] [place], [place], [address] Phone: [phone]   Fax:  [phone]

## 2014-11-24 ENCOUNTER — Ambulatory Visit: Payer: 59 | Admitting: Physical Therapy

## 2014-11-29 ENCOUNTER — Ambulatory Visit: Payer: 59 | Admitting: Physical Therapy

## 2014-11-29 DIAGNOSIS — M545 Low back pain, unspecified: Secondary | ICD-10-CM

## 2014-11-29 NOTE — Therapy (Addendum)
[place], [place], [address] Phone: [phone]   Fax:  [phone]  Physical Therapy Treatment  Patient Details  Name: Leslie Kirby MRN: [id number] Date of Birth: [birth year] Referring Provider:  Benito Mccreedy, MD  Encounter Date: 11/29/2014      PT End of Session - 11/29/14 1621    Visit Number 5   Number of Visits 12   Date for PT Re-Evaluation 12/30/14   PT Start Time 3244   PT Stop Time 1635   PT Time Calculation (min) 50 min   Activity Tolerance Patient tolerated treatment well   Behavior During Therapy [facility] for tasks assessed/performed      No past medical history on file.  Past Surgical History  Procedure Laterality Date  . No past surgeries      There were no vitals taken for this visit.  Visit Diagnosis:  Right-sided low back pain without sciatica      Subjective Assessment - 11/29/14 1548    Symptoms Pain is "ok."  Intermittent; today felt goo   Limitations Sitting   How long can you sit comfortably? 2 hours   How long can you stand comfortably? no problem   How long can you walk comfortably? unlimited   Patient Stated Goals decrease pain, no pain with ADLs and work   Currently in Pain? Yes   Pain Score 4    Pain Location Back   Pain Orientation Right;Mid;Lower   Pain Type Chronic pain   Pain Onset More than a month ago   Pain Frequency Intermittent   Aggravating Factors  bending to right side   Pain Relieving Factors advil, heat   Multiple Pain Sites No                    [facility] Adult PT Treatment/Exercise - 11/29/14 1549    Lumbar Exercises: Stretches   Lower Trunk Rotation 30 seconds;3 reps   Press Ups 10 seconds;Other (comment)  10 reps   Quadruped Mid Back Stretch 3 reps;10 seconds  mid, L and R   Lumbar Exercises: Aerobic   Stationary Bike NuStep Level 6 x 8 min, 4 extremities   Lumbar Exercises: Prone   Single Arm Raise 10  reps;Right;Left;2 seconds   Single Arm Raises Limitations min cues to decrease trunk rotation   Straight Leg Raise 10 reps;2 seconds   Opposite Arm/Leg Raise Right arm/Left leg;Left arm/Right leg;10 reps;2 seconds   Lumbar Exercises: Quadruped   [place] 10 reps   Modalities   Modalities Electrical Stimulation;Moist Heat   Moist Heat Therapy   Number Minutes Moist Heat 15 Minutes   Moist Heat Location Other (comment)  low back   Electrical Stimulation   Electrical Stimulation Location R low back   Electrical Stimulation Action IFC   Electrical Stimulation Parameters to tolerance   Electrical Stimulation Goals Pain                PT Education - 11/29/14 1620    Education provided Yes   Education Details POC and goals of care; pt requesting to decrease to 1x/wk due to finances   Person(s) Educated Patient   Methods Explanation   Comprehension Verbalized understanding             PT Long Term Goals - 11/29/14 1623    PT LONG TERM GOAL #1   Title independent with HEP (12/12/14)   Time 6   Period Weeks   Status On-going  PT LONG TERM GOAL #2   Title perform active LROM without pain (12/12/14)   Time 6   Period Weeks   Status On-going   PT LONG TERM GOAL #3   Title report ability to work full shift without increase in pain (12/12/14)   Time 6   Period Weeks   Status On-going   PT LONG TERM GOAL #4   Title improve hip strength to at least 4+/5 flexion/ext/abdct for improved hip stability (12/12/14)   Time 6   Period Weeks   Status On-going               Plan - 11/29/14 1621    Clinical Impression Statement 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  Will plan for transition to home program next 1-2 weeks.   PT Frequency 1x / week   PT Next Visit Plan hip abdct strengthening, prone/quadruped exercises, treadmill   Consulted and Agree with Plan of Care Patient         Problem List There are no active problems to display for this patient.  Laureen Abrahams, PT, DPT 11/29/2014 4:44 PM  [facility] [address] [place], [place], [address] Phone: [phone]   Fax:  [phone]     PHYSICAL THERAPY DISCHARGE SUMMARY  Visits from Start of Care: 5  Current functional level related to goals / functional outcomes: See above; goals not formally assessed as pt did not return.  Pt canceled remaining appointments and did not reschedule.   Remaining deficits: unknown   Education / Equipment: HEP  Plan: Patient agrees to discharge.  Patient goals were not met. Patient is being discharged due to not returning since the last visit.  ?????    Laureen Abrahams, PT, DPT 01/20/2015 8:58 AM  [facility] [address] [place] [address]  [phone] (office) [phone] (fax)

## 2014-11-30 ENCOUNTER — Ambulatory Visit: Payer: 59 | Admitting: Physical Therapy

## 2014-12-13 ENCOUNTER — Ambulatory Visit: Payer: 59 | Admitting: Physical Therapy

## 2014-12-15 ENCOUNTER — Ambulatory Visit: Payer: 59 | Admitting: Physical Therapy

## 2015-01-23 ENCOUNTER — Telehealth: Payer: Self-pay | Admitting: *Deleted

## 2015-01-23 ENCOUNTER — Ambulatory Visit: Payer: 59 | Admitting: Diagnostic Neuroimaging

## 2015-01-23 NOTE — Telephone Encounter (Signed)
Spoke with pt on the phone informing her that I needed to reschedule her appt, she stated that she would call me back with her schedule and let me know when she could come in. I thanked her and she thanked me. Stated she would call back.

## 2015-01-23 NOTE — Telephone Encounter (Signed)
When she calls back, reschedule her for today at 2 pm thanks

## 2015-05-31 ENCOUNTER — Other Ambulatory Visit (HOSPITAL_COMMUNITY): Payer: Self-pay | Admitting: Orthopedic Surgery

## 2015-05-31 DIAGNOSIS — M546 Pain in thoracic spine: Secondary | ICD-10-CM

## 2015-06-05 ENCOUNTER — Encounter (HOSPITAL_COMMUNITY)
Admission: RE | Admit: 2015-06-05 | Discharge: 2015-06-05 | Disposition: A | Discharge status: Home / Self Care | Payer: 59 | Source: Ambulatory Visit | Attending: Orthopedic Surgery | Admitting: Orthopedic Surgery

## 2015-06-05 DIAGNOSIS — M546 Pain in thoracic spine: Secondary | ICD-10-CM | POA: Diagnosis not present

## 2015-06-05 MED ORDER — TECHNETIUM TC 99M MEDRONATE IV KIT
25.0000 | PACK | Freq: Once | INTRAVENOUS | Status: AC | PRN
  Administered 2015-06-05: 25 via INTRAVENOUS

## 2016-02-05 DIAGNOSIS — H524 Presbyopia: Secondary | ICD-10-CM | POA: Diagnosis not present

## 2016-02-05 DIAGNOSIS — H52223 Regular astigmatism, bilateral: Secondary | ICD-10-CM | POA: Diagnosis not present

## 2016-02-05 DIAGNOSIS — H5203 Hypermetropia, bilateral: Secondary | ICD-10-CM | POA: Diagnosis not present

## 2016-02-12 ENCOUNTER — Ambulatory Visit: Payer: Self-pay

## 2016-02-12 ENCOUNTER — Ambulatory Visit (INDEPENDENT_AMBULATORY_CARE_PROVIDER_SITE_OTHER): Payer: 59 | Admitting: Podiatry

## 2016-02-12 ENCOUNTER — Ambulatory Visit (INDEPENDENT_AMBULATORY_CARE_PROVIDER_SITE_OTHER): Payer: 59

## 2016-02-12 ENCOUNTER — Encounter: Payer: Self-pay | Admitting: Podiatry

## 2016-02-12 VITALS — BP 126/92 | HR 80 | Resp 16 | Ht 64.0 in | Wt 210.0 lb

## 2016-02-12 DIAGNOSIS — M722 Plantar fascial fibromatosis: Secondary | ICD-10-CM | POA: Diagnosis not present

## 2016-02-12 MED ORDER — DICLOFENAC SODIUM 75 MG PO TBEC: 75.0000 mg | DELAYED_RELEASE_TABLET | Freq: Two times a day (BID) | ORAL | Status: DC

## 2016-02-12 MED ORDER — TRIAMCINOLONE ACETONIDE 10 MG/ML IJ SUSP
10.0000 mg | Freq: Once | INTRAMUSCULAR | Status: AC
  Administered 2016-02-12: 10 mg

## 2016-02-12 MED FILL — DICLOFENAC SOD EC 75 MG TAB: 75 | 25 days supply | Qty: 50 | Fill #0

## 2016-02-12 NOTE — Progress Notes (Signed)
   Subjective:    Patient ID: Leslie Kirby, female    DOB: 08/18/1971, 45 y.o.   MRN: [id number]  HPI Patient presents with bilateral foot pain; heel; x6 months   Review of Systems  Musculoskeletal: Positive for gait problem.  All other systems reviewed and are negative.      Objective:   Physical Exam        Assessment & Plan:

## 2016-02-12 NOTE — Patient Instructions (Signed)

## 2016-02-14 NOTE — Progress Notes (Signed)
Subjective:     Patient ID: Leslie Kirby, female   DOB: 1971/01/20, 45 y.o.   MRN: [id number]  HPI patient states I've had a lot of pain in my heel left over right with inflammation and fluid around the medial band. It's been present for at least 6 months and gradually getting worse   Review of Systems  All other systems reviewed and are negative.      Objective:   Physical Exam  Constitutional: She is oriented to person, place, and time.  Cardiovascular: Intact distal pulses.   Musculoskeletal: Normal range of motion.  Neurological: She is oriented to person, place, and time.  Skin: Skin is warm.  Nursing note and vitals reviewed.  neurovascular status intact muscle strength adequate range of motion within normal limits with patient found to have exquisite discomfort plantar heel left over right with fluid buildup around the medial band. Moderate depression of the arch is also noted     Assessment:     Acute plantar fasciitis left over right    Plan:     H&P and x-rays reviewed with patient. Today I injected the plantar fascia bilateral 3 mg Kenalog 5 mill grams Xylocaine and applied fascial brace to lift the arch is and discussed long-term orthotic treatment. Placed on diclofenac 75 mg twice a day gave instructions on physical therapy and reappoint to recheck in 2 weeks   X-ray report indicated small spurs with no indications of stress fracture or advanced arthritis

## 2016-02-26 ENCOUNTER — Encounter: Payer: Self-pay | Admitting: Podiatry

## 2016-02-26 ENCOUNTER — Ambulatory Visit (INDEPENDENT_AMBULATORY_CARE_PROVIDER_SITE_OTHER): Payer: 59 | Admitting: Podiatry

## 2016-02-26 VITALS — BP 124/75 | HR 82 | Resp 16

## 2016-02-26 DIAGNOSIS — M722 Plantar fascial fibromatosis: Secondary | ICD-10-CM

## 2016-02-26 MED ORDER — TRIAMCINOLONE ACETONIDE 10 MG/ML IJ SUSP
10.0000 mg | Freq: Once | INTRAMUSCULAR | Status: AC
  Administered 2016-02-26: 10 mg

## 2016-02-27 NOTE — Progress Notes (Signed)
Subjective:     Patient ID: Leslie Kirby, female   DOB: 1971/08/09, 45 y.o.   MRN: [id number]  HPI patient states I'm still having pain with the left heel with the right when doing better. 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 Reappoint when orthotics returned

## 2016-03-22 ENCOUNTER — Ambulatory Visit (INDEPENDENT_AMBULATORY_CARE_PROVIDER_SITE_OTHER): Payer: 59 | Admitting: *Deleted

## 2016-03-22 DIAGNOSIS — M722 Plantar fascial fibromatosis: Secondary | ICD-10-CM

## 2016-03-22 NOTE — Patient Instructions (Signed)

## 2016-03-22 NOTE — Progress Notes (Signed)
Patient ID: Leslie Kirby, female   DOB: [birth year], 45 y.o.   MRN: [id number] Patient presents for orthotic pick up.  Verbal and written break in and wear instructions given.  Patient will follow up in 4 weeks if symptoms worsen or fail to improve.

## 2016-05-08 DIAGNOSIS — N644 Mastodynia: Secondary | ICD-10-CM | POA: Diagnosis not present

## 2016-05-08 DIAGNOSIS — M940 Chondrocostal junction syndrome [Tietze]: Secondary | ICD-10-CM | POA: Diagnosis not present

## 2016-05-08 MED FILL — NAPROXEN 500 MG TABLET: 500 | 20 days supply | Qty: 60 | Fill #0

## 2016-05-23 DIAGNOSIS — M940 Chondrocostal junction syndrome [Tietze]: Secondary | ICD-10-CM | POA: Diagnosis not present

## 2016-05-23 DIAGNOSIS — N644 Mastodynia: Secondary | ICD-10-CM | POA: Diagnosis not present

## 2016-08-05 ENCOUNTER — Encounter: Payer: Self-pay | Admitting: Podiatry

## 2016-08-05 ENCOUNTER — Ambulatory Visit (INDEPENDENT_AMBULATORY_CARE_PROVIDER_SITE_OTHER): Payer: 59 | Admitting: Podiatry

## 2016-08-05 ENCOUNTER — Ambulatory Visit (INDEPENDENT_AMBULATORY_CARE_PROVIDER_SITE_OTHER): Payer: 59

## 2016-08-05 DIAGNOSIS — N200 Calculus of kidney: Secondary | ICD-10-CM | POA: Diagnosis not present

## 2016-08-05 DIAGNOSIS — M779 Enthesopathy, unspecified: Secondary | ICD-10-CM | POA: Diagnosis not present

## 2016-08-05 DIAGNOSIS — M79671 Pain in right foot: Secondary | ICD-10-CM

## 2016-08-05 DIAGNOSIS — R3121 Asymptomatic microscopic hematuria: Secondary | ICD-10-CM | POA: Diagnosis not present

## 2016-08-05 MED ORDER — TRIAMCINOLONE ACETONIDE 10 MG/ML IJ SUSP
10.0000 mg | Freq: Once | INTRAMUSCULAR | Status: AC
  Administered 2016-08-05: 10 mg

## 2016-08-07 NOTE — Progress Notes (Signed)
Subjective:     Patient ID: Leslie Kirby, female   DOB: 13-Sep-1971, 45 y.o.   MRN: [id number]  HPI patient states she still having pain with her right foot but it has improved some   Review of Systems     Objective:   Physical Exam Neurovascular status intact with tendinitis improved lateral aspect right ankle but still present    Assessment:     Continue tendinitis with moderate improvement    Plan:     Careful sheath injection administered along with ankle compression stocking to reduce pressure and to reduce stress on the tendon. Reappoint to recheck

## 2016-08-09 DIAGNOSIS — R739 Hyperglycemia, unspecified: Secondary | ICD-10-CM | POA: Diagnosis not present

## 2016-08-09 DIAGNOSIS — K219 Gastro-esophageal reflux disease without esophagitis: Secondary | ICD-10-CM | POA: Diagnosis not present

## 2016-08-09 DIAGNOSIS — E785 Hyperlipidemia, unspecified: Secondary | ICD-10-CM | POA: Diagnosis not present

## 2016-08-09 DIAGNOSIS — Z5181 Encounter for therapeutic drug level monitoring: Secondary | ICD-10-CM | POA: Diagnosis not present

## 2016-08-09 DIAGNOSIS — E559 Vitamin D deficiency, unspecified: Secondary | ICD-10-CM | POA: Diagnosis not present

## 2016-08-09 DIAGNOSIS — R110 Nausea: Secondary | ICD-10-CM | POA: Diagnosis not present

## 2016-08-09 DIAGNOSIS — N3001 Acute cystitis with hematuria: Secondary | ICD-10-CM | POA: Diagnosis not present

## 2016-08-09 MED FILL — CIPROFLOXACIN HCL 500 MG TA: 500 | 3 days supply | Qty: 6 | Fill #0

## 2016-08-19 ENCOUNTER — Telehealth: Payer: Self-pay | Admitting: Podiatry

## 2016-08-19 NOTE — Telephone Encounter (Signed)
Called patient and left her a voicemail to let her know her orthotics have arrived and if she still wants to be seen this Friday 06 October to call the office back.

## 2016-08-23 DIAGNOSIS — E785 Hyperlipidemia, unspecified: Secondary | ICD-10-CM | POA: Diagnosis not present

## 2016-08-23 DIAGNOSIS — K219 Gastro-esophageal reflux disease without esophagitis: Secondary | ICD-10-CM | POA: Diagnosis not present

## 2016-08-23 DIAGNOSIS — E559 Vitamin D deficiency, unspecified: Secondary | ICD-10-CM | POA: Diagnosis not present

## 2016-08-23 DIAGNOSIS — R739 Hyperglycemia, unspecified: Secondary | ICD-10-CM | POA: Diagnosis not present

## 2016-08-23 DIAGNOSIS — Z1389 Encounter for screening for other disorder: Secondary | ICD-10-CM | POA: Diagnosis not present

## 2016-08-26 ENCOUNTER — Encounter: Payer: 59 | Admitting: Podiatry

## 2016-08-26 NOTE — Patient Instructions (Signed)

## 2016-08-29 ENCOUNTER — Ambulatory Visit: Payer: 59 | Admitting: Podiatry

## 2016-08-29 NOTE — Progress Notes (Signed)
This encounter was created in error - please disregard.

## 2016-09-04 ENCOUNTER — Ambulatory Visit (INDEPENDENT_AMBULATORY_CARE_PROVIDER_SITE_OTHER): Payer: 59 | Admitting: Podiatry

## 2016-09-04 DIAGNOSIS — M722 Plantar fascial fibromatosis: Secondary | ICD-10-CM | POA: Diagnosis not present

## 2016-09-04 DIAGNOSIS — M779 Enthesopathy, unspecified: Secondary | ICD-10-CM | POA: Diagnosis not present

## 2016-09-04 NOTE — Patient Instructions (Signed)

## 2016-09-04 NOTE — Progress Notes (Signed)
Subjective:     Patient ID: Leslie Kirby, female   DOB: [birth year], 45 y.o.   MRN: [id number]  HPI patient states I'm quite a bit improved from where I was previously   Review of Systems     Objective:   Physical Exam Neurovascular status intact with diminished discomfort in the right ankle both medial lateral sides with tendons that her not bad inflamed currently    Assessment:     Improve tendinitis right    Plan:     Orthotics dispensed with all instructions on usage and reappoint to recheck

## 2016-10-24 DIAGNOSIS — Z5181 Encounter for therapeutic drug level monitoring: Secondary | ICD-10-CM | POA: Diagnosis not present

## 2016-10-24 DIAGNOSIS — R739 Hyperglycemia, unspecified: Secondary | ICD-10-CM | POA: Diagnosis not present

## 2016-10-24 DIAGNOSIS — K219 Gastro-esophageal reflux disease without esophagitis: Secondary | ICD-10-CM | POA: Diagnosis not present

## 2016-10-24 DIAGNOSIS — M549 Dorsalgia, unspecified: Secondary | ICD-10-CM | POA: Diagnosis not present

## 2016-10-24 DIAGNOSIS — E559 Vitamin D deficiency, unspecified: Secondary | ICD-10-CM | POA: Diagnosis not present

## 2016-10-24 DIAGNOSIS — E785 Hyperlipidemia, unspecified: Secondary | ICD-10-CM | POA: Diagnosis not present

## 2016-10-28 DIAGNOSIS — N951 Menopausal and female climacteric states: Secondary | ICD-10-CM | POA: Diagnosis not present

## 2016-10-28 DIAGNOSIS — D259 Leiomyoma of uterus, unspecified: Secondary | ICD-10-CM | POA: Diagnosis not present

## 2016-10-28 DIAGNOSIS — Z1231 Encounter for screening mammogram for malignant neoplasm of breast: Secondary | ICD-10-CM | POA: Diagnosis not present

## 2016-10-28 DIAGNOSIS — Z6833 Body mass index (BMI) 33.0-33.9, adult: Secondary | ICD-10-CM | POA: Diagnosis not present

## 2016-10-28 DIAGNOSIS — Z01411 Encounter for gynecological examination (general) (routine) with abnormal findings: Secondary | ICD-10-CM | POA: Diagnosis not present

## 2016-10-28 DIAGNOSIS — N912 Amenorrhea, unspecified: Secondary | ICD-10-CM | POA: Diagnosis not present

## 2016-10-29 MED FILL — predniSONE 20 MG TABS: 20 | 7 days supply | Qty: 7 | Fill #0

## 2016-10-29 MED FILL — LIDOCAINE 5% PATCH: 5 | 30 days supply | Qty: 30 | Fill #0

## 2016-10-29 MED FILL — tiZANidine HCL 4 MG TABS: 4 | 10 days supply | Qty: 30 | Fill #0

## 2016-10-29 MED FILL — DICLOFENAC SOD 75 MG TAB EC: 75 | 30 days supply | Qty: 60 | Fill #0

## 2016-11-04 DIAGNOSIS — M545 Low back pain: Secondary | ICD-10-CM | POA: Diagnosis not present

## 2016-11-09 DIAGNOSIS — M546 Pain in thoracic spine: Secondary | ICD-10-CM | POA: Diagnosis not present

## 2016-11-09 DIAGNOSIS — M545 Low back pain: Secondary | ICD-10-CM | POA: Diagnosis not present

## 2016-11-13 DIAGNOSIS — M546 Pain in thoracic spine: Secondary | ICD-10-CM | POA: Diagnosis not present

## 2016-11-13 DIAGNOSIS — M545 Low back pain: Secondary | ICD-10-CM | POA: Diagnosis not present

## 2016-11-14 ENCOUNTER — Ambulatory Visit: Payer: 59 | Attending: Internal Medicine | Admitting: Physical Therapy

## 2016-11-14 DIAGNOSIS — G8929 Other chronic pain: Secondary | ICD-10-CM | POA: Diagnosis present

## 2016-11-14 DIAGNOSIS — M545 Low back pain: Secondary | ICD-10-CM | POA: Diagnosis not present

## 2016-11-14 NOTE — Therapy (Signed)
[place], [place], [address] Phone: [phone]   Fax:  [phone]  Physical Therapy Evaluation  Patient Details  Name: Leslie Kirby MRN: [id number] Date of Birth: Jan 01, 1971 Referring Provider: Laroy Apple, MD  Encounter Date: 11/14/2016      PT End of Session - 11/14/16 1020    Visit Number 1   Number of Visits 13   Date for PT Re-Evaluation 12/26/16   Authorization Type [facility]   PT Start Time [id number]   PT Stop Time 1101   PT Time Calculation (min) 46 min   Activity Tolerance Patient tolerated treatment well   Behavior During Therapy [facility] for tasks assessed/performed      No past medical history on file.  Past Surgical History:  Procedure Laterality Date  . NO PAST SURGERIES      There were no vitals filed for this visit.       Subjective Assessment - 11/14/16 1020    Subjective Was in PT for the same issue about a year ago, denies any help. Feels bilateral LBP that wraps into rib cage. Has pain but can still do what she needs to do, takes medication for management (unable to remember medication name). Reports taking muscle relaxers. Pain progresses with day. Denies regular exercise, does some in the summer. Does not belong to a gym. LBP for about 2 years, got worse again about 1 month ago   Limitations Sitting   How long can you walk comfortably? 1 hour   Patient Stated Goals decrease pain   Currently in Pain? Yes   Pain Score 5    Pain Location Back   Pain Orientation Left;Right;Lower   Pain Descriptors / Indicators Aching   Aggravating Factors  sitting, a lot of movement   Pain Relieving Factors muscle relaxers            [facility] PT Assessment - 11/14/16 0001      Assessment   Medical Diagnosis thoracolumbar myofascial pain & bilat flank/oblique pain   Referring Provider Laroy Apple, MD   Onset Date/Surgical Date --  2 years   Hand Dominance Right   Next MD Visit  not scheduled at this time   Prior Therapy last year for this issue     Precautions   Precautions None     Restrictions   Weight Bearing Restrictions No     Balance Screen   Has the patient fallen in the past 6 months No     [place] residence   Living Arrangements Spouse/significant other   Additional Comments multiple levels     Prior Function   Level of Independence Independent     Cognition   Overall Cognitive Status Within Functional Limits for tasks assessed     Observation/Other Assessments   Focus on Therapeutic Outcomes (FOTO)  76% ability     Sensation   Additional Comments [facility]     Posture/Postural Control   Posture Comments increased lumbar lordosis, anterior pelvic tilt     ROM / Strength   AROM / PROM / Strength AROM;Strength     AROM   Overall AROM Comments lumbar ROM WFL, tightness with flexion     Strength   Strength Assessment Site Hip   Right/Left Hip Right;Left   Right Hip Flexion 3+/5  lumbar ext noted   Right Hip Extension 4+/5   Right Hip ABduction 4-/5   Left Hip Flexion 3+/5  lumbar  ext noted   Left Hip Extension 4+/5   Left Hip ABduction 3+/5     Palpation   SI assessment  WFL   Palpation comment bilat QL TTP                   [facility] Adult PT Treatment/Exercise - 11/14/16 0001      Exercises   Exercises Lumbar     Lumbar Exercises: Stretches   Single Knee to Chest Stretch 10 seconds;2 reps   Lower Trunk Rotation 2 reps;10 seconds  bilat     Lumbar Exercises: Supine   AB Set Limitations supine, seated and standing     Modalities   Modalities Moist Heat     Moist Heat Therapy   Number Minutes Moist Heat 10 Minutes  3 min concurrent with education   Moist Heat Location Lumbar Spine                PT Education - 11/14/16 1038    Education provided Yes   Education Details anatomy of condition, POC, HEP, exercise form/rationale, muscular support to lumbar spine,  importance of regular exercise   Person(s) Educated Patient   Methods Explanation;Demonstration;Tactile cues;Verbal cues;Handout   Comprehension Verbalized understanding;Returned demonstration;Verbal cues required;Tactile cues required;Need further instruction          PT Short Term Goals - 11/14/16 1322      PT SHORT TERM GOAL #1   Title Pt will verbalize ability to utilize core contraction throughout her day to provide support to lumbar spine by 1/19   Baseline began educating at eval   Time 3   Period Weeks   Status New     PT SHORT TERM GOAL #2   Title Pt will be able to perform trunk flexion with minimal discomfort/stretch in bilateral QL   Baseline severe discomfort and used hands on legs to stand   Time 3   Period Weeks   Status New           PT Long Term Goals - 11/14/16 1323      PT LONG TERM GOAL #1   Title Pt will perform pushing/pulling of sled with proper use of abdominal control to perform work activities by 2/8   Baseline began educating on abdominal control at eval   Time 6   Period Weeks   Status New     PT LONG TERM GOAL #2   Title Pt will be able to perform SLR without hyperextension of lumbar spine, indicating improved abdominal strength in conjunction wiht LE movements   Baseline unable at eval   Time 6   Period Weeks   Status New     PT LONG TERM GOAL #3   Title Average daily pain <=3/10 to decrease pain effects on daily activities   Baseline up to 9/10 on a daily basis reported at eval   Time 6   Period Weeks   Status New     PT LONG TERM GOAL #4   Title Hip abduction strength to at least 4+/5 to provide necessary support to lumbar spine   Baseline see flowsheet   Time 6   Period Weeks   Status New               Plan - 11/14/16 1317    Clinical Impression Statement pt presents to PT with complaints of LBP that wraps around to her rib cage bilaterally. Pt has pain but reports she is able to do activities with aid  of  medication. Pt with notable lack of abdominal control resulting in excessive lordosis and tightening of bilateral QL. Pt will benefit from skilled PT in order to stretch lumbar musculature and improve abdominal control to decrease LBP and reach long term functional goals.    Rehab Potential Fair   Clinical Impairments Affecting Rehab Potential repeat of physical therapy for this impairment   PT Frequency 2x / week   PT Duration 6 weeks   PT Treatment/Interventions ADLs/Self Care Home Management;Cryotherapy;Electrical Stimulation;Iontophoresis 4mg /ml Dexamethasone;Functional mobility training;Stair training;Gait training;Ultrasound;Traction;Moist Heat;Therapeutic activities;Therapeutic exercise;Neuromuscular re-education;Patient/family education;Passive range of motion;Manual techniques;Dry needling;Taping   PT Next Visit Plan nu step, QL stretching, abdominal strength   PT Home Exercise Plan LTR, SKTC, pelvic tilt   Consulted and Agree with Plan of Care Patient      Patient will benefit from skilled therapeutic intervention in order to improve the following deficits and impairments:  Difficulty walking, Increased muscle spasms, Decreased activity tolerance, Pain, Improper body mechanics, Decreased strength, Postural dysfunction  Visit Diagnosis: Chronic bilateral low back pain, with sciatica presence unspecified - Plan: PT plan of care cert/re-cert     Problem List There are no active problems to display for this patient.   Kynslei Art C. Chauna Osoria PT, DPT 11/14/16 1:29 PM   [facility] [address] [place], [place], [address] Phone: [phone]   Fax:  [phone]  Name: Leslie Kirby MRN: [id number] Date of Birth: [birth year]

## 2016-11-19 ENCOUNTER — Ambulatory Visit: Payer: 59 | Attending: Internal Medicine | Admitting: Physical Therapy

## 2016-11-19 ENCOUNTER — Encounter: Payer: Self-pay | Admitting: Physical Therapy

## 2016-11-19 DIAGNOSIS — M545 Low back pain: Secondary | ICD-10-CM | POA: Diagnosis not present

## 2016-11-19 DIAGNOSIS — G8929 Other chronic pain: Secondary | ICD-10-CM | POA: Insufficient documentation

## 2016-11-19 NOTE — Therapy (Signed)
[place], [place], [address] Phone: [phone]   Fax:  [phone]  Physical Therapy Treatment  Patient Details  Name: Leslie Kirby MRN: [id number] Date of Birth: [birth year] Referring Provider: Laroy Apple, MD  Encounter Date: 11/19/2016      PT End of Session - 11/19/16 1639    Visit Number 2   Number of Visits 13   Date for PT Re-Evaluation 12/26/16   Authorization Type [facility]   PT Start Time 1638   PT Stop Time [id number]  ended early due to pt feeling light-headed and reporting HA   PT Time Calculation (min) 34 min   Activity Tolerance Patient tolerated treatment well   Behavior During Therapy [facility] for tasks assessed/performed      History reviewed. No pertinent past medical history.  Past Surgical History:  Procedure Laterality Date  . NO PAST SURGERIES      There were no vitals filed for this visit.      Subjective Assessment - 11/19/16 1640    Subjective Pt reports back is okay due to taking pain medicine. Pt reports doing pelvic tilts at home which resulted in abdominal soreness.    Patient Stated Goals decrease pain   Currently in Pain? Yes   Pain Score 4    Pain Location Back   Pain Orientation Lower;Mid   Pain Descriptors / Indicators Sore   Pain Relieving Factors medications                         [facility] Adult PT Treatment/Exercise - 11/19/16 0001      Lumbar Exercises: Stretches   Single Knee to Chest Stretch 10 seconds;3 reps  bilat   Lower Trunk Rotation 5 reps;1 rep;10 seconds     Lumbar Exercises: Aerobic   Stationary Bike nu step L5 5 min     Lumbar Exercises: Supine   AB Set Limitations post pelvic tilt with ball squeeze bw knees   Bridge 10 reps   Bridge Limitations ball bw knees   Straight Leg Raise 15 reps  both   Other Supine Lumbar Exercises decompression 5-exercise series     Lumbar Exercises: Sidelying   Clam 20 reps  bilat                 PT Education - 11/19/16 1642    Education provided Yes   Education Details exercise form/rationale, HEP   Person(s) Educated Patient   Methods Explanation;Demonstration;Tactile cues;Verbal cues   Comprehension Verbalized understanding;Returned demonstration;Verbal cues required;Tactile cues required;Need further instruction          PT Short Term Goals - 11/14/16 1322      PT SHORT TERM GOAL #1   Title Pt will verbalize ability to utilize core contraction throughout her day to provide support to lumbar spine by 1/19   Baseline began educating at eval   Time 3   Period Weeks   Status New     PT SHORT TERM GOAL #2   Title Pt will be able to perform trunk flexion with minimal discomfort/stretch in bilateral QL   Baseline severe discomfort and used hands on legs to stand   Time 3   Period Weeks   Status New           PT Long Term Goals - 11/14/16 1323      PT LONG TERM GOAL #1   Title Pt will perform pushing/pulling of sled with  proper use of abdominal control to perform work activities by 2/8   Baseline began educating on abdominal control at eval   Time 6   Period Weeks   Status New     PT LONG TERM GOAL #2   Title Pt will be able to perform SLR without hyperextension of lumbar spine, indicating improved abdominal strength in conjunction wiht LE movements   Baseline unable at eval   Time 6   Period Weeks   Status New     PT LONG TERM GOAL #3   Title Average daily pain <=3/10 to decrease pain effects on daily activities   Baseline up to 9/10 on a daily basis reported at eval   Time 6   Period Weeks   Status New     PT LONG TERM GOAL #4   Title Hip abduction strength to at least 4+/5 to provide necessary support to lumbar spine   Baseline see flowsheet   Time 6   Period Weeks   Status New               Plan - 11/19/16 1716    Clinical Impression Statement Pt with signficiant difficulty performing exercises today. Reported  soreness in low back as she performed decompression exercises. Improved ability to initiate core contraction with fewer cues. Pt stands most of the day and was instructed to perform decompression exercises before going to bed at night. Pt was monitored and released after HA and light-headed symptoms resolved.    PT Next Visit Plan nu step, QL stretching, abdominal strength   PT Home Exercise Plan LTR, SKTC, pelvic tilt; decompression exercises   Consulted and Agree with Plan of Care Patient      Patient will benefit from skilled therapeutic intervention in order to improve the following deficits and impairments:     Visit Diagnosis: Chronic bilateral low back pain, with sciatica presence unspecified     Problem List There are no active problems to display for this patient.   Maudean Hoffmann C. Bunnie Rehberg PT, DPT 11/19/16 5:20 PM   [facility] [address] [place], [place], [address] Phone: [phone]   Fax:  [phone]  Name: Leslie Kirby MRN: [id number] Date of Birth: 07/11/1971

## 2016-11-20 ENCOUNTER — Ambulatory Visit: Payer: 59 | Admitting: Physical Therapy

## 2016-11-29 ENCOUNTER — Ambulatory Visit: Payer: 59 | Admitting: Physical Therapy

## 2016-11-29 VITALS — BP 131/89

## 2016-11-29 DIAGNOSIS — M545 Low back pain: Principal | ICD-10-CM

## 2016-11-29 DIAGNOSIS — G8929 Other chronic pain: Secondary | ICD-10-CM | POA: Diagnosis not present

## 2016-11-29 NOTE — Therapy (Addendum)
[facility] [address] [place], [place], [address] Phone: [phone]   Fax:  [phone]  Physical Therapy Treatment/Discharge Summary  Patient Details  Name: Leslie Kirby MRN: [id number] Date of Birth: 04/25/1971 Referring Provider: [place] Ibazebo, MD  Encounter Date: 11/29/2016      PT End of Session - 11/29/16 1107    Visit Number 3   Number of Visits 13   Date for PT Re-Evaluation 12/26/16   Authorization Type [facility]   PT Start Time 1101   PT Stop Time 1145   PT Time Calculation (min) 44 min      No past medical history on file.  Past Surgical History:  Procedure Laterality Date  . NO PAST SURGERIES      Vitals:   11/29/16 1148  BP: 131/89        Subjective Assessment - 11/29/16 1105    Subjective Doing good with exercises. Over did it yesterday at work, a lot of walking.    Currently in Pain? Yes   Pain Score 6    Pain Location Back   Pain Orientation Mid;Left;Right;Lower   Pain Descriptors / Indicators Aching   Aggravating Factors  walked alot at work    Pain Relieving Factors sitting                          [facility] Adult PT Treatment/Exercise - 11/29/16 0001      Self-Care   Self-Care Other Self-Care Comments   Other Self-Care Comments  Log roll x 4 with heavy cues      Lumbar Exercises: Stretches   Single Knee to Chest Stretch 3 reps;30 seconds   Lower Trunk Rotation 5 reps;1 rep;10 seconds     Lumbar Exercises: Supine   AB Set Limitations post pelvic tilt with ball squeeze bw knees  heavy cues for glutes to engage.    Bridge 10 reps   Bridge Limitations with DF, glute squeeze   Straight Leg Raise 15 reps  both   Other Supine Lumbar Exercises decompression 5-exercise series  requires increased pain.                   PT Short Term Goals - 11/14/16 1322      PT SHORT TERM GOAL #1   Title Pt will verbalize ability to utilize core contraction  throughout her day to provide support to lumbar spine by 1/19   Baseline began educating at eval   Time 3   Period Weeks   Status New     PT SHORT TERM GOAL #2   Title Pt will be able to perform trunk flexion with minimal discomfort/stretch in bilateral QL   Baseline severe discomfort and used hands on legs to stand   Time 3   Period Weeks   Status New           PT Long Term Goals - 11/14/16 1323      PT LONG TERM GOAL #1   Title Pt will perform pushing/pulling of sled with proper use of abdominal control to perform work activities by 2/8   Baseline began educating on abdominal control at eval   Time 6   Period Weeks   Status New     PT LONG TERM GOAL #2   Title Pt will be able to perform SLR without hyperextension of lumbar spine, indicating improved abdominal strength in conjunction wiht LE movements   Baseline unable at   eval   Time 6   Period Weeks   Status New     PT LONG TERM GOAL #3   Title Average daily pain <=3/10 to decrease pain effects on daily activities   Baseline up to 9/10 on a daily basis reported at eval   Time 6   Period Weeks   Status New     PT LONG TERM GOAL #4   Title Hip abduction strength to at least 4+/5 to provide necessary support to lumbar spine   Baseline see flowsheet   Time 6   Period Weeks   Status New               Plan - 11/29/16 1144    Clinical Impression Statement Pt reports increased pain today due to alot of walking at work yesterday. Review of HEP and new decopression exercises. Pt requires increased time and heavy cues. She continues to lift bilateral LEs while in supine causing increased LE strain. She was unable to perfrom glute bridge due to pain, however was able to lift hips quickly and easily with bed mobility. Pt declined heat modality today. After sitting up she reports dizziness. BP 131/89. Asked her to consult MD if symptoms persist. Dizziness resolved within 3 minutes while sitting Edge of mat.    PT Next  Visit Plan nu step, QL stretching, abdominal strength; check STGs    PT Home Exercise Plan LTR, SKTC, pelvic tilt; decompression exercises   Consulted and Agree with Plan of Care Patient      Patient will benefit from skilled therapeutic intervention in order to improve the following deficits and impairments:  Difficulty walking, Increased muscle spasms, Decreased activity tolerance, Pain, Improper body mechanics, Decreased strength, Postural dysfunction  Visit Diagnosis: Chronic bilateral low back pain, with sciatica presence unspecified     Problem List There are no active problems to display for this patient.   [place], Jessica McGee, PTA 11/29/2016, 11:51 AM  [facility] [address] [place], [place], [address] Phone: [phone]   Fax:  [phone]  Name: Leslie Kirby MRN: [id number] Date of Birth: 06/16/1971   PHYSICAL THERAPY DISCHARGE SUMMARY  Visits from Start of Care: 3  Current functional level related to goals / functional outcomes: See above   Remaining deficits: See above   Education / Equipment: Anatomy of condition, POC, HEP exercise form/rationale  Plan: Patient agrees to discharge.  Patient goals were not met. Patient is being discharged due to not returning since the last visit.  ?????    Jessica C. Hightower PT, DPT 01/21/17 9:26 AM   

## 2016-12-02 ENCOUNTER — Ambulatory Visit: Payer: 59 | Admitting: Physical Therapy

## 2016-12-13 ENCOUNTER — Ambulatory Visit: Payer: 59 | Admitting: Physical Therapy

## 2016-12-16 ENCOUNTER — Ambulatory Visit: Payer: 59 | Admitting: Physical Therapy

## 2016-12-19 ENCOUNTER — Ambulatory Visit: Payer: 59 | Attending: Internal Medicine | Admitting: Physical Therapy

## 2016-12-23 ENCOUNTER — Ambulatory Visit: Payer: 59 | Admitting: Physical Therapy

## 2016-12-25 ENCOUNTER — Ambulatory Visit: Payer: 59 | Admitting: Physical Therapy

## 2017-01-09 DIAGNOSIS — K219 Gastro-esophageal reflux disease without esophagitis: Secondary | ICD-10-CM | POA: Diagnosis not present

## 2017-01-09 DIAGNOSIS — E785 Hyperlipidemia, unspecified: Secondary | ICD-10-CM | POA: Diagnosis not present

## 2017-01-09 DIAGNOSIS — E559 Vitamin D deficiency, unspecified: Secondary | ICD-10-CM | POA: Diagnosis not present

## 2017-01-09 DIAGNOSIS — J029 Acute pharyngitis, unspecified: Secondary | ICD-10-CM | POA: Diagnosis not present

## 2017-01-09 DIAGNOSIS — J019 Acute sinusitis, unspecified: Secondary | ICD-10-CM | POA: Diagnosis not present

## 2017-01-09 DIAGNOSIS — H9202 Otalgia, left ear: Secondary | ICD-10-CM | POA: Diagnosis not present

## 2017-01-09 MED FILL — AZITHROMYCIN 250 MG TABLET: 250 | 5 days supply | Qty: 6 | Fill #0

## 2017-01-09 MED FILL — ACETIC ACID 2% EAR SOLUTION: 2 | 5 days supply | Qty: 15 | Fill #0

## 2017-01-09 MED FILL — FLUTICASONE PROP 50 MCG SPR: 50 | 30 days supply | Qty: 16 | Fill #0

## 2017-01-20 DIAGNOSIS — G5711 Meralgia paresthetica, right lower limb: Secondary | ICD-10-CM | POA: Diagnosis not present

## 2018-02-06 DIAGNOSIS — R3121 Asymptomatic microscopic hematuria: Secondary | ICD-10-CM | POA: Diagnosis not present

## 2018-02-06 DIAGNOSIS — N200 Calculus of kidney: Secondary | ICD-10-CM | POA: Diagnosis not present

## 2018-03-23 DIAGNOSIS — Z6833 Body mass index (BMI) 33.0-33.9, adult: Secondary | ICD-10-CM | POA: Diagnosis not present

## 2018-03-23 DIAGNOSIS — N644 Mastodynia: Secondary | ICD-10-CM | POA: Diagnosis not present

## 2018-03-23 DIAGNOSIS — Z1231 Encounter for screening mammogram for malignant neoplasm of breast: Secondary | ICD-10-CM | POA: Diagnosis not present

## 2018-03-23 DIAGNOSIS — N750 Cyst of Bartholin's gland: Secondary | ICD-10-CM | POA: Diagnosis not present

## 2018-03-23 DIAGNOSIS — Z01411 Encounter for gynecological examination (general) (routine) with abnormal findings: Secondary | ICD-10-CM | POA: Diagnosis not present

## 2018-03-23 DIAGNOSIS — D259 Leiomyoma of uterus, unspecified: Secondary | ICD-10-CM | POA: Diagnosis not present

## 2018-03-26 DIAGNOSIS — N644 Mastodynia: Secondary | ICD-10-CM | POA: Diagnosis not present

## 2018-04-03 DIAGNOSIS — Z0100 Encounter for examination of eyes and vision without abnormal findings: Secondary | ICD-10-CM | POA: Diagnosis not present

## 2018-04-03 DIAGNOSIS — E785 Hyperlipidemia, unspecified: Secondary | ICD-10-CM | POA: Diagnosis not present

## 2018-04-03 DIAGNOSIS — Z0110 Encounter for examination of ears and hearing without abnormal findings: Secondary | ICD-10-CM | POA: Diagnosis not present

## 2018-04-03 DIAGNOSIS — E559 Vitamin D deficiency, unspecified: Secondary | ICD-10-CM | POA: Diagnosis not present

## 2018-04-03 DIAGNOSIS — Z0000 Encounter for general adult medical examination without abnormal findings: Secondary | ICD-10-CM | POA: Diagnosis not present

## 2018-04-03 DIAGNOSIS — K219 Gastro-esophageal reflux disease without esophagitis: Secondary | ICD-10-CM | POA: Diagnosis not present

## 2018-09-04 ENCOUNTER — Ambulatory Visit (INDEPENDENT_AMBULATORY_CARE_PROVIDER_SITE_OTHER): Payer: 59

## 2018-09-04 ENCOUNTER — Other Ambulatory Visit: Payer: Self-pay | Admitting: Podiatry

## 2018-09-04 ENCOUNTER — Encounter: Payer: Self-pay | Admitting: Podiatry

## 2018-09-04 ENCOUNTER — Ambulatory Visit (INDEPENDENT_AMBULATORY_CARE_PROVIDER_SITE_OTHER): Payer: 59 | Admitting: Podiatry

## 2018-09-04 DIAGNOSIS — M722 Plantar fascial fibromatosis: Secondary | ICD-10-CM | POA: Diagnosis not present

## 2018-09-04 DIAGNOSIS — N750 Cyst of Bartholin's gland: Secondary | ICD-10-CM | POA: Insufficient documentation

## 2018-09-04 DIAGNOSIS — M79671 Pain in right foot: Secondary | ICD-10-CM

## 2018-09-04 DIAGNOSIS — N951 Menopausal and female climacteric states: Secondary | ICD-10-CM | POA: Insufficient documentation

## 2018-09-04 DIAGNOSIS — M79672 Pain in left foot: Principal | ICD-10-CM

## 2018-09-04 DIAGNOSIS — D259 Leiomyoma of uterus, unspecified: Secondary | ICD-10-CM | POA: Insufficient documentation

## 2018-09-04 MED ORDER — TRIAMCINOLONE ACETONIDE 10 MG/ML IJ SUSP
10.0000 mg | Freq: Once | INTRAMUSCULAR | Status: AC
  Administered 2018-09-04: 10 mg

## 2018-09-04 NOTE — Patient Instructions (Signed)

## 2018-09-07 NOTE — Progress Notes (Signed)
Subjective:   Patient ID: Leslie Kirby, female   DOB: 47 y.o.   MRN: [id number]   HPI Patient presents stating that she has a lot of pain in the heels of both feet and also in the forefoot which is not as extensive but it is still sore with palpation   ROS      Objective:  Physical Exam  Neurovascular status intact with exquisite discomfort plantar aspect heel region bilateral with inflammation fluid buildup and also noted to have moderate depression of the arch with mild forefoot discomfort     Assessment:  Inflammatory fasciitis bilateral with moderate depression of the arch along with forefoot inflammation which is probably compensatory     Plan:  H&P x-rays reviewed and today I injected the plantar fascia after sterile prep of each foot 3 mg Kenalog 5 mg Xylocaine and applied fascial brace bilateral with instructions on usage.  Reappoint 3 weeks may require orthotics or other treatment  X-rays indicate there is moderate depression of the arch with spur formation bilateral with no indication of stress fracture

## 2018-09-25 ENCOUNTER — Ambulatory Visit: Payer: 59 | Admitting: Podiatry

## 2019-03-31 DIAGNOSIS — E785 Hyperlipidemia, unspecified: Secondary | ICD-10-CM | POA: Diagnosis not present

## 2019-03-31 DIAGNOSIS — Z0000 Encounter for general adult medical examination without abnormal findings: Secondary | ICD-10-CM | POA: Diagnosis not present

## 2019-03-31 DIAGNOSIS — Z136 Encounter for screening for cardiovascular disorders: Secondary | ICD-10-CM | POA: Diagnosis not present

## 2019-03-31 DIAGNOSIS — K219 Gastro-esophageal reflux disease without esophagitis: Secondary | ICD-10-CM | POA: Diagnosis not present

## 2019-03-31 DIAGNOSIS — Z131 Encounter for screening for diabetes mellitus: Secondary | ICD-10-CM | POA: Diagnosis not present

## 2019-03-31 DIAGNOSIS — E559 Vitamin D deficiency, unspecified: Secondary | ICD-10-CM | POA: Diagnosis not present

## 2019-03-31 DIAGNOSIS — R0789 Other chest pain: Secondary | ICD-10-CM | POA: Diagnosis not present

## 2019-04-02 ENCOUNTER — Other Ambulatory Visit: Payer: Self-pay | Admitting: Internal Medicine

## 2019-04-02 DIAGNOSIS — Z1231 Encounter for screening mammogram for malignant neoplasm of breast: Secondary | ICD-10-CM

## 2019-04-05 MED FILL — NAPROXEN 500 MG TABLET: 500 | 7 days supply | Qty: 14 | Fill #0

## 2019-05-10 DIAGNOSIS — Z1211 Encounter for screening for malignant neoplasm of colon: Secondary | ICD-10-CM | POA: Diagnosis not present

## 2019-05-19 MED FILL — PEG-3350 SOLUTION: 420 | 1 days supply | Qty: 4000 | Fill #0

## 2019-06-01 DIAGNOSIS — Z1211 Encounter for screening for malignant neoplasm of colon: Secondary | ICD-10-CM | POA: Diagnosis not present

## 2019-06-28 DIAGNOSIS — E559 Vitamin D deficiency, unspecified: Secondary | ICD-10-CM | POA: Diagnosis not present

## 2019-06-28 DIAGNOSIS — K219 Gastro-esophageal reflux disease without esophagitis: Secondary | ICD-10-CM | POA: Diagnosis not present

## 2019-06-28 DIAGNOSIS — E785 Hyperlipidemia, unspecified: Secondary | ICD-10-CM | POA: Diagnosis not present

## 2019-06-28 DIAGNOSIS — M62838 Other muscle spasm: Secondary | ICD-10-CM | POA: Diagnosis not present

## 2019-06-28 MED FILL — OMEPRAZOLE DR 40 MG CAPSULE: 40 | 90 days supply | Qty: 90 | Fill #0

## 2019-06-28 MED FILL — tiZANidine HCL 4 MG TABS: 4 | 10 days supply | Qty: 30 | Fill #0

## 2019-08-03 DIAGNOSIS — Z1231 Encounter for screening mammogram for malignant neoplasm of breast: Secondary | ICD-10-CM | POA: Diagnosis not present

## 2019-08-03 DIAGNOSIS — Z01419 Encounter for gynecological examination (general) (routine) without abnormal findings: Secondary | ICD-10-CM | POA: Diagnosis not present

## 2019-08-03 DIAGNOSIS — Z124 Encounter for screening for malignant neoplasm of cervix: Secondary | ICD-10-CM | POA: Diagnosis not present

## 2019-10-01 DIAGNOSIS — E6609 Other obesity due to excess calories: Secondary | ICD-10-CM | POA: Diagnosis not present

## 2019-10-01 DIAGNOSIS — E559 Vitamin D deficiency, unspecified: Secondary | ICD-10-CM | POA: Diagnosis not present

## 2019-10-01 DIAGNOSIS — E782 Mixed hyperlipidemia: Secondary | ICD-10-CM | POA: Diagnosis not present

## 2019-10-01 DIAGNOSIS — R1013 Epigastric pain: Secondary | ICD-10-CM | POA: Diagnosis not present

## 2019-10-01 MED FILL — PANTOPRAZOLE SOD DR 40 MG T: 40 | 90 days supply | Qty: 90 | Fill #0

## 2019-10-01 MED FILL — SUCRALFATE 1 GM TABLET: 1 | 30 days supply | Qty: 120 | Fill #0

## 2020-01-07 DIAGNOSIS — E782 Mixed hyperlipidemia: Secondary | ICD-10-CM | POA: Diagnosis not present

## 2020-01-07 DIAGNOSIS — E6609 Other obesity due to excess calories: Secondary | ICD-10-CM | POA: Diagnosis not present

## 2020-01-07 DIAGNOSIS — R1011 Right upper quadrant pain: Secondary | ICD-10-CM | POA: Diagnosis not present

## 2020-01-07 DIAGNOSIS — E559 Vitamin D deficiency, unspecified: Secondary | ICD-10-CM | POA: Diagnosis not present

## 2020-01-07 DIAGNOSIS — R0789 Other chest pain: Secondary | ICD-10-CM | POA: Diagnosis not present

## 2020-01-07 DIAGNOSIS — R1013 Epigastric pain: Secondary | ICD-10-CM | POA: Diagnosis not present

## 2020-01-07 MED FILL — PANTOPRAZOLE SOD DR 40 MG T: 40 | 90 days supply | Qty: 180 | Fill #0

## 2020-01-07 MED FILL — ANASPAZ 0.125 MG TABLET ODT: 0.125 | 10 days supply | Qty: 30 | Fill #0

## 2020-04-20 ENCOUNTER — Other Ambulatory Visit: Payer: Self-pay | Admitting: Physician Assistant

## 2020-04-20 DIAGNOSIS — R079 Chest pain, unspecified: Secondary | ICD-10-CM

## 2020-04-20 DIAGNOSIS — Z1329 Encounter for screening for other suspected endocrine disorder: Secondary | ICD-10-CM | POA: Diagnosis not present

## 2020-04-20 DIAGNOSIS — E559 Vitamin D deficiency, unspecified: Secondary | ICD-10-CM | POA: Diagnosis not present

## 2020-04-20 DIAGNOSIS — R1013 Epigastric pain: Secondary | ICD-10-CM | POA: Diagnosis not present

## 2020-04-20 DIAGNOSIS — R109 Unspecified abdominal pain: Secondary | ICD-10-CM

## 2020-04-20 DIAGNOSIS — E782 Mixed hyperlipidemia: Secondary | ICD-10-CM | POA: Diagnosis not present

## 2020-04-20 DIAGNOSIS — G8929 Other chronic pain: Secondary | ICD-10-CM

## 2020-04-20 DIAGNOSIS — Z01118 Encounter for examination of ears and hearing with other abnormal findings: Secondary | ICD-10-CM | POA: Diagnosis not present

## 2020-04-20 DIAGNOSIS — Z136 Encounter for screening for cardiovascular disorders: Secondary | ICD-10-CM | POA: Diagnosis not present

## 2020-04-20 DIAGNOSIS — Z01021 Encounter for examination of eyes and vision following failed vision screening with abnormal findings: Secondary | ICD-10-CM | POA: Diagnosis not present

## 2020-04-20 DIAGNOSIS — Z131 Encounter for screening for diabetes mellitus: Secondary | ICD-10-CM | POA: Diagnosis not present

## 2020-04-20 DIAGNOSIS — Z0001 Encounter for general adult medical examination with abnormal findings: Secondary | ICD-10-CM | POA: Diagnosis not present

## 2020-04-20 DIAGNOSIS — E6609 Other obesity due to excess calories: Secondary | ICD-10-CM | POA: Diagnosis not present

## 2020-04-28 ENCOUNTER — Ambulatory Visit
Admission: RE | Admit: 2020-04-28 | Discharge: 2020-04-28 | Disposition: A | Discharge status: Home / Self Care | Payer: 59 | Source: Ambulatory Visit | Attending: Internal Medicine | Admitting: Internal Medicine

## 2020-04-28 ENCOUNTER — Other Ambulatory Visit: Payer: Self-pay

## 2020-04-28 DIAGNOSIS — Z1231 Encounter for screening mammogram for malignant neoplasm of breast: Secondary | ICD-10-CM

## 2020-04-28 DIAGNOSIS — R3121 Asymptomatic microscopic hematuria: Secondary | ICD-10-CM | POA: Diagnosis not present

## 2020-04-28 DIAGNOSIS — N200 Calculus of kidney: Secondary | ICD-10-CM | POA: Diagnosis not present

## 2020-05-09 ENCOUNTER — Ambulatory Visit
Admission: RE | Admit: 2020-05-09 | Discharge: 2020-05-09 | Disposition: A | Discharge status: Home / Self Care | Payer: 59 | Source: Ambulatory Visit | Attending: Physician Assistant | Admitting: Physician Assistant

## 2020-05-09 DIAGNOSIS — R109 Unspecified abdominal pain: Secondary | ICD-10-CM

## 2020-05-09 DIAGNOSIS — R079 Chest pain, unspecified: Secondary | ICD-10-CM | POA: Diagnosis not present

## 2020-05-09 DIAGNOSIS — N200 Calculus of kidney: Secondary | ICD-10-CM | POA: Diagnosis not present

## 2020-05-09 DIAGNOSIS — G8929 Other chronic pain: Secondary | ICD-10-CM

## 2020-05-09 MED ORDER — IOPAMIDOL (ISOVUE-300) INJECTION 61%
100.0000 mL | Freq: Once | INTRAVENOUS | Status: AC | PRN
  Administered 2020-05-09: 100 mL via INTRAVENOUS

## 2020-06-19 DIAGNOSIS — R3121 Asymptomatic microscopic hematuria: Secondary | ICD-10-CM | POA: Diagnosis not present

## 2020-06-19 DIAGNOSIS — N200 Calculus of kidney: Secondary | ICD-10-CM | POA: Diagnosis not present

## 2020-09-06 ENCOUNTER — Other Ambulatory Visit: Payer: Self-pay | Admitting: Obstetrics and Gynecology

## 2020-09-06 DIAGNOSIS — E049 Nontoxic goiter, unspecified: Secondary | ICD-10-CM | POA: Diagnosis not present

## 2020-09-06 DIAGNOSIS — Z01419 Encounter for gynecological examination (general) (routine) without abnormal findings: Secondary | ICD-10-CM | POA: Diagnosis not present

## 2020-09-14 ENCOUNTER — Other Ambulatory Visit: Payer: 59

## 2020-09-15 ENCOUNTER — Ambulatory Visit
Admission: RE | Admit: 2020-09-15 | Discharge: 2020-09-15 | Disposition: A | Discharge status: Home / Self Care | Payer: 59 | Source: Ambulatory Visit | Attending: Obstetrics and Gynecology | Admitting: Obstetrics and Gynecology

## 2020-09-15 DIAGNOSIS — E042 Nontoxic multinodular goiter: Secondary | ICD-10-CM | POA: Diagnosis not present

## 2020-09-15 DIAGNOSIS — E049 Nontoxic goiter, unspecified: Secondary | ICD-10-CM

## 2020-10-31 DIAGNOSIS — N8189 Other female genital prolapse: Secondary | ICD-10-CM | POA: Diagnosis not present

## 2020-10-31 DIAGNOSIS — N814 Uterovaginal prolapse, unspecified: Secondary | ICD-10-CM | POA: Diagnosis not present

## 2020-12-24 IMAGING — CT CT ABD-PELV W/ CM
2 series · 13 of 42 positions shown, 15 images · IV contrast (iopamidol)
Comparison: Chest CT 10/13/2009.  Abdomen/pelvis CT 06/16/2015.

CLINICAL DATA: Chronic chest pain with right upper quadrant pain
radiating across the mid abdomen to the left lower quadrant.

EXAM:
CT CHEST, ABDOMEN, AND PELVIS WITH CONTRAST
TECHNIQUE: Multidetector CT imaging of the chest, abdomen and pelvis was
performed following the standard protocol during bolus
administration of intravenous contrast.
CONTRAST:  100mL F9P61U-EII IOPAMIDOL (F9P61U-EII) INJECTION 61%

[Series 2: cap with 5.00 br40 s3 axial · axial · 0.71mm/px · z∈[+1156,+1671]mm · 10 of 119 slices shown, 12 images]
[im 8/119  soft-tissue]
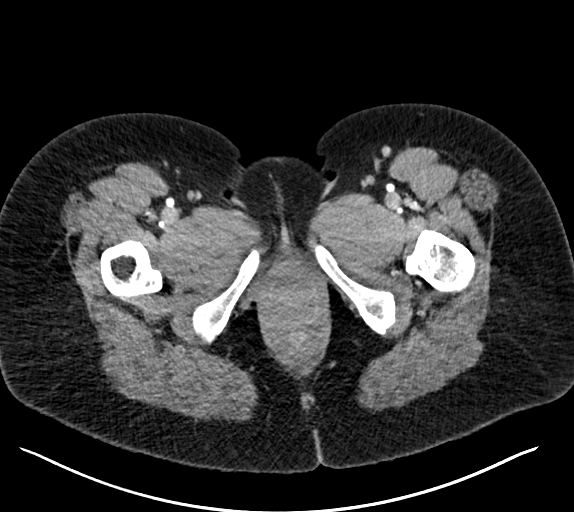
[im 8/119  bone]
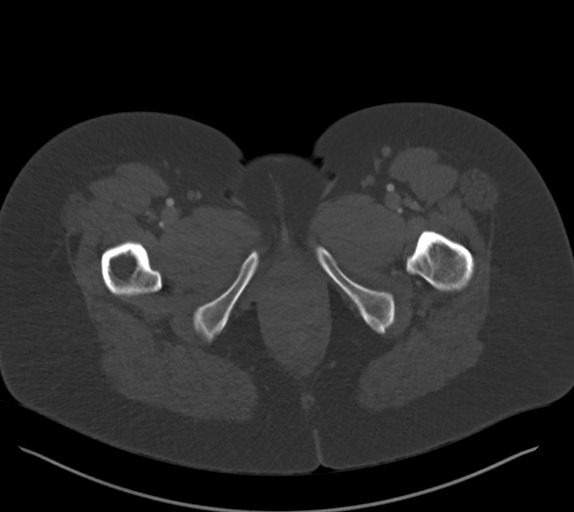
[im 20/119  soft-tissue]
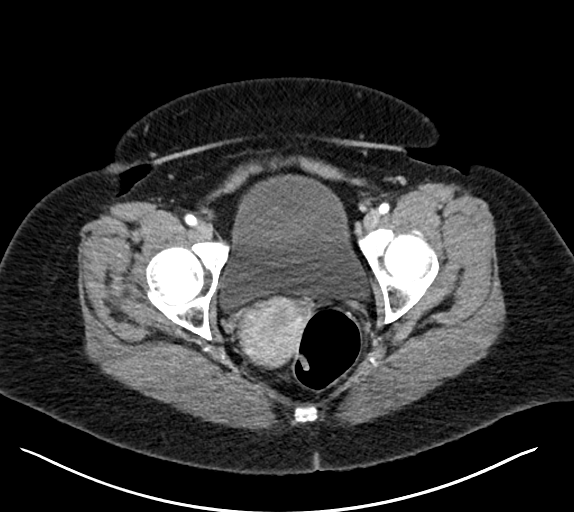
[im 31/119  soft-tissue]
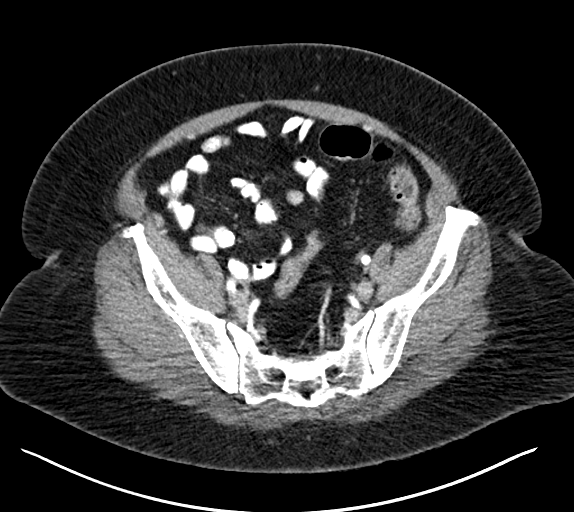
[im 42/119  soft-tissue]
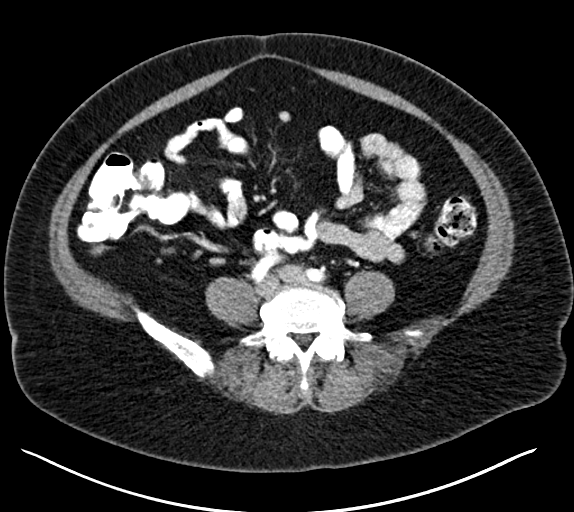
[im 54/119  soft-tissue]
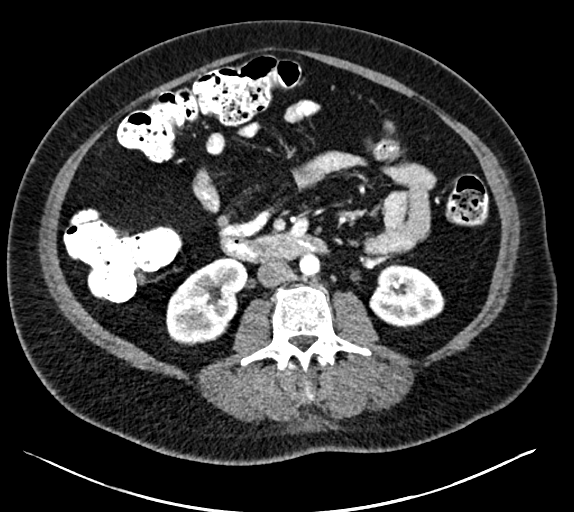
[im 65/119  soft-tissue]
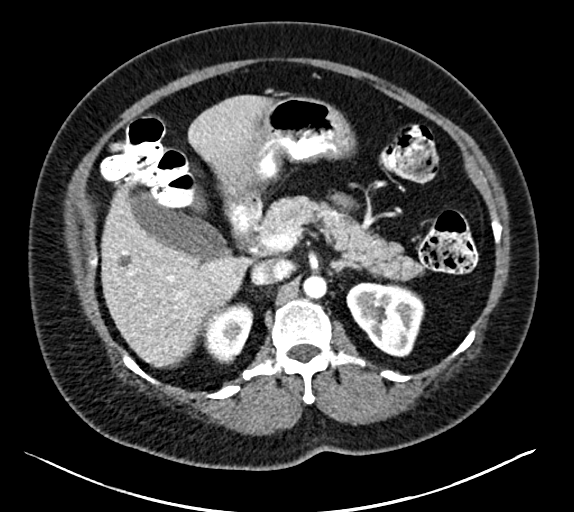
[im 77/119  soft-tissue]
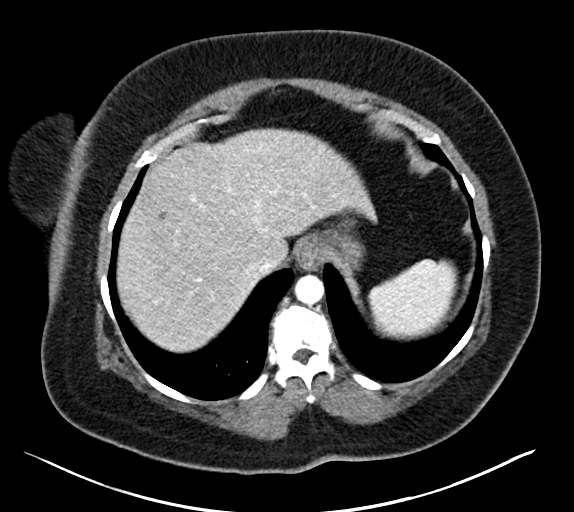
[im 88/119  soft-tissue]
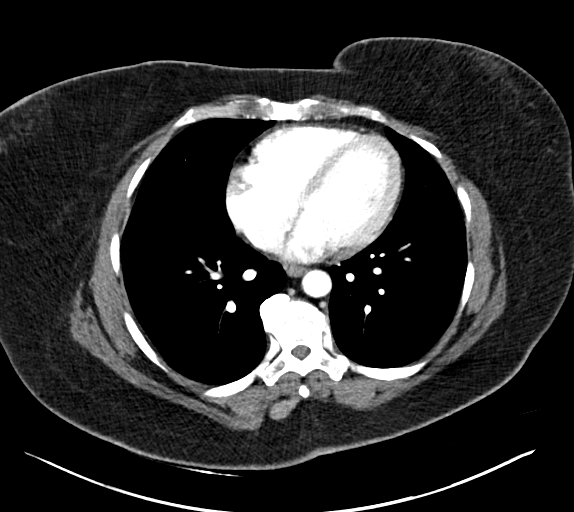
[im 99/119  soft-tissue]
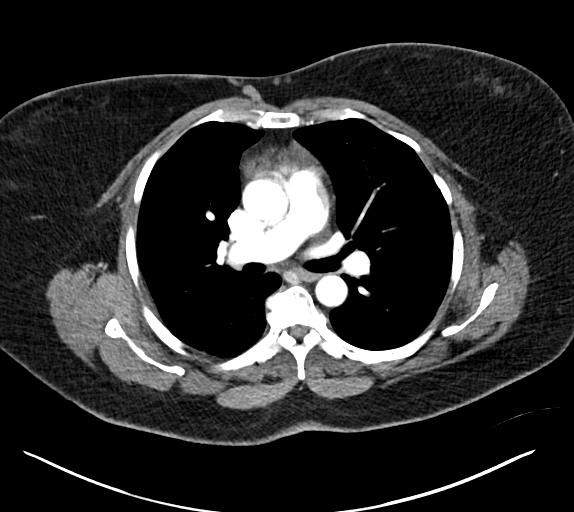
[im 99/119  bone]
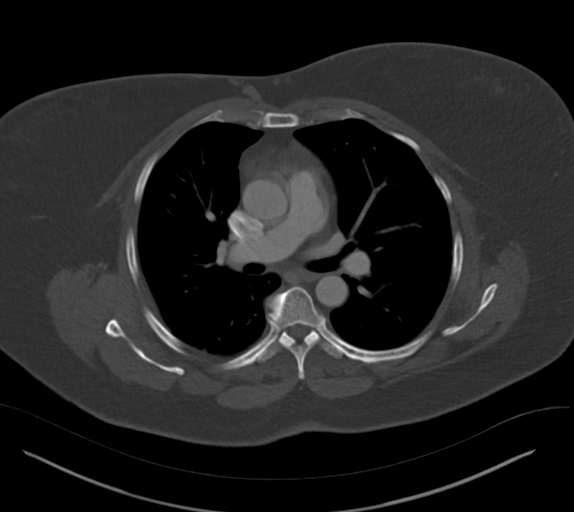
[im 111/119  soft-tissue]
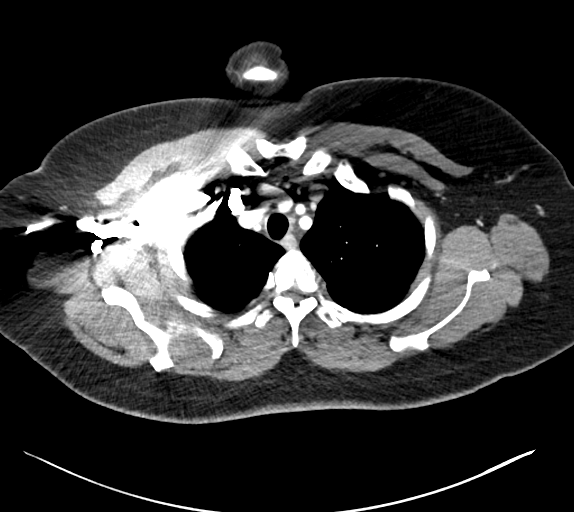

[Series 6: cap with 2.00 br40 s3 cor · coronal · 0.79mm/px · 3 of 181 slices shown]
[im 61/181  soft-tissue]
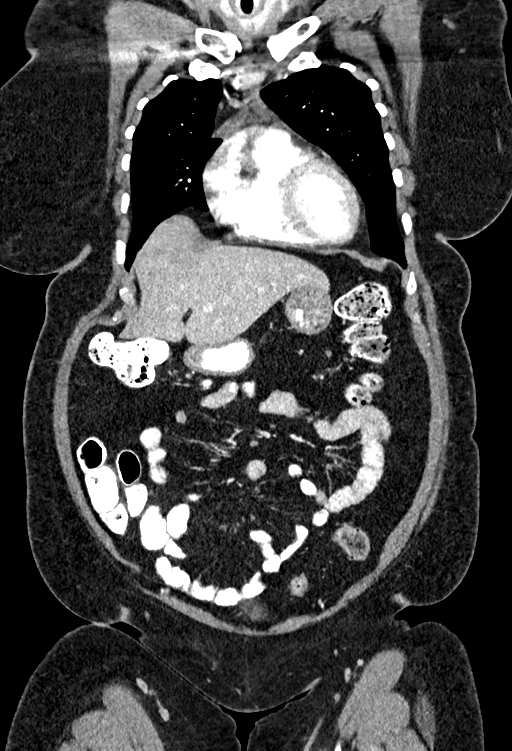
[im 81/181  soft-tissue]
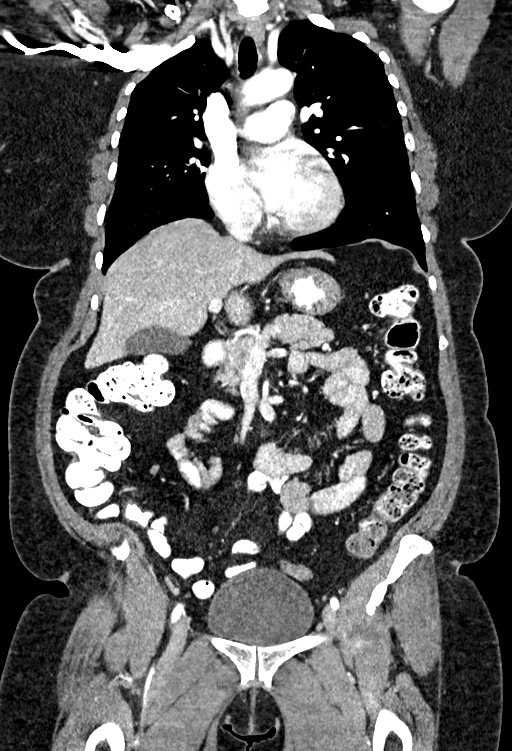
[im 101/181  soft-tissue]
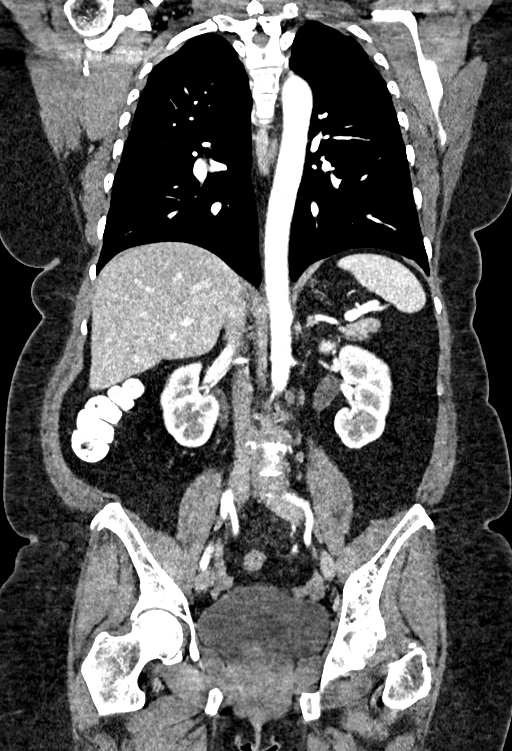

[13 of 42 positions shown; findings below may reference images not displayed]

FINDINGS: CT CHEST FINDINGS

Cardiovascular: The heart size is normal. No substantial pericardial
effusion. No thoracic aortic aneurysm.

Mediastinum/Nodes: No mediastinal lymphadenopathy. There is no hilar
lymphadenopathy. Tiny hiatal hernia. The esophagus has normal
imaging features. There is no axillary lymphadenopathy.

Lungs/Pleura: No suspicious pulmonary nodule or mass. No focal
airspace consolidation. No pleural effusion.

Musculoskeletal: No worrisome lytic or sclerotic osseous
abnormality.

CT ABDOMEN PELVIS FINDINGS

Hepatobiliary: 3 tiny hypodensities identified in the hepatic
parenchyma are stable since 06/16/2015 consistent with benign
etiology. There is no evidence for gallstones, gallbladder wall
thickening, or pericholecystic fluid. No intrahepatic or
extrahepatic biliary dilation.

Pancreas: No focal mass lesion. No dilatation of the main duct. No
intraparenchymal cyst. No peripancreatic edema.

Spleen: No splenomegaly. No focal mass lesion.

Adrenals/Urinary Tract: No adrenal nodule or mass. 3-4 mm
nonobstructing stone identified upper interpolar left kidney (image
118 coronal series 6). 7 mm hypoattenuating lesion upper pole right
kidney is stable since 0566, compatible with tiny cyst. No evidence
for hydroureter. The urinary bladder appears normal for the degree
of distention.

Stomach/Bowel: Tiny hiatal hernia. Stomach otherwise unremarkable.
Duodenum is normally positioned as is the ligament of Treitz. No
small bowel wall thickening. No small bowel dilatation. The terminal
ileum is normal. The appendix is best seen on coronal images and is
unremarkable. No gross colonic mass. No colonic wall thickening.

Vascular/Lymphatic: No abdominal aortic aneurysm. No abdominal
aortic atherosclerotic calcification. There is no gastrohepatic or
hepatoduodenal ligament lymphadenopathy. No retroperitoneal or
mesenteric lymphadenopathy. No pelvic sidewall lymphadenopathy.

Reproductive: The uterus is unremarkable.  There is no adnexal mass.

Other: No intraperitoneal free fluid.

Musculoskeletal: No worrisome lytic or sclerotic osseous
abnormality.
IMPRESSION: 1. No acute findings in the chest, abdomen, or pelvis. Specifically,
no findings to explain the patient's history of chronic chest and
abdominal pain.
2. 3-4 mm nonobstructing left renal stone.
3. Tiny hiatal hernia.

## 2020-12-24 IMAGING — CT CT CHEST W/ CM
2 of 5 series · 13 of 46 positions shown, 15 images · IV contrast (iopamidol)
Comparison: Chest CT 10/13/2009.  Abdomen/pelvis CT 06/16/2015.

CLINICAL DATA: Chronic chest pain with right upper quadrant pain
radiating across the mid abdomen to the left lower quadrant.

EXAM:
CT CHEST, ABDOMEN, AND PELVIS WITH CONTRAST
TECHNIQUE: Multidetector CT imaging of the chest, abdomen and pelvis was
performed following the standard protocol during bolus
administration of intravenous contrast.
CONTRAST:  100mL F9P61U-EII IOPAMIDOL (F9P61U-EII) INJECTION 61%

[Series 2: cap with 5.00 br40 s3 axial · axial · 0.71mm/px · z∈[+1171,+1656]mm · 10 of 119 slices shown, 12 images]
[im 11/119  soft-tissue]
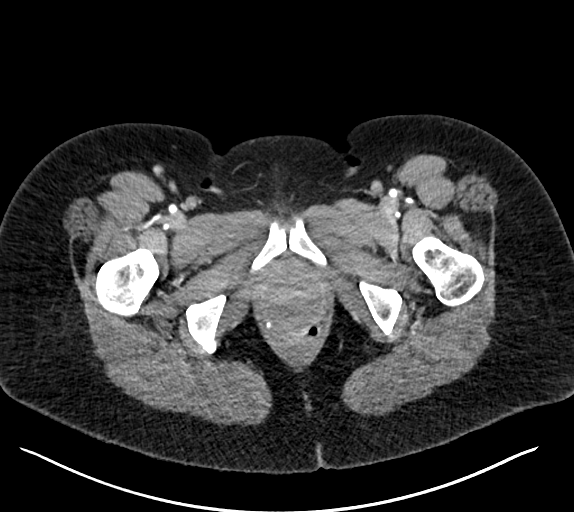
[im 11/119  bone]
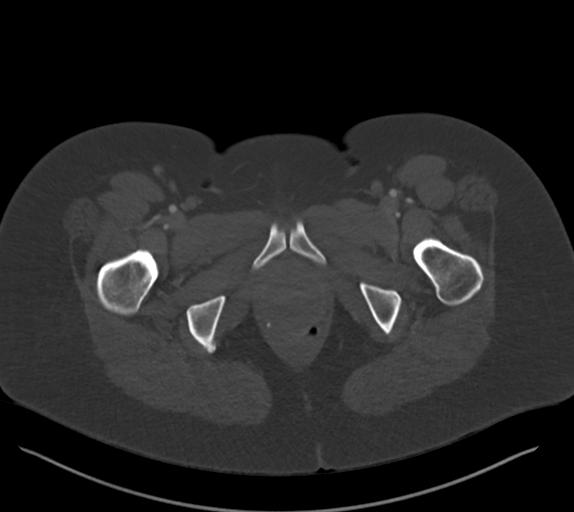
[im 22/119  soft-tissue]
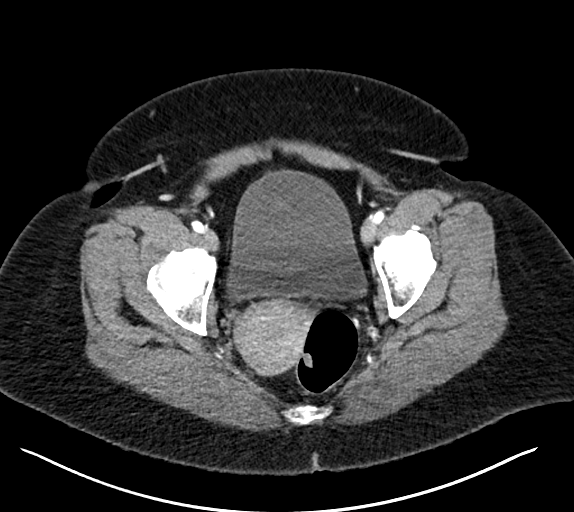
[im 33/119  soft-tissue]
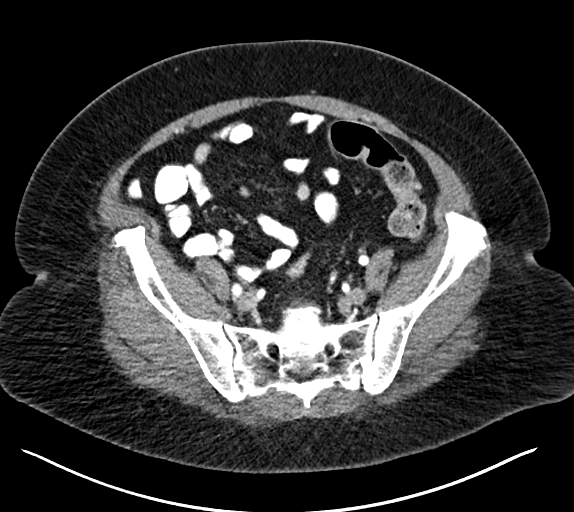
[im 43/119  soft-tissue]
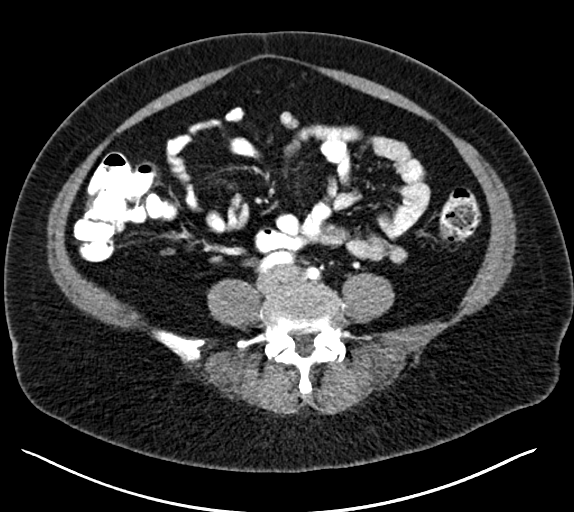
[im 54/119  soft-tissue]
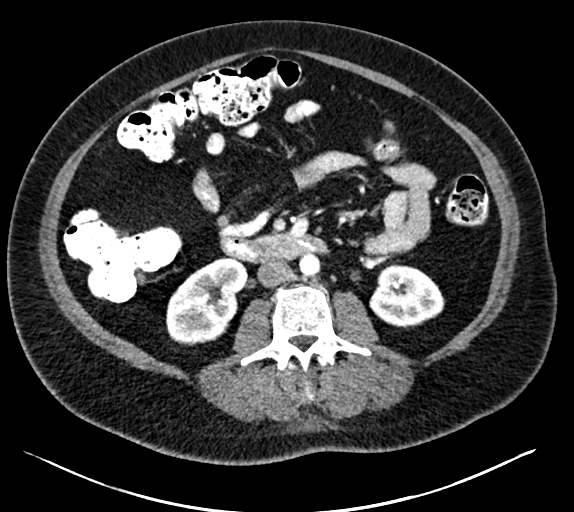
[im 65/119  soft-tissue]
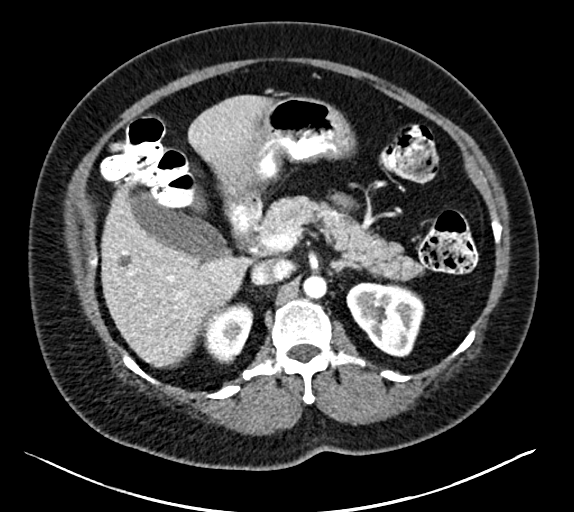
[im 76/119  soft-tissue]
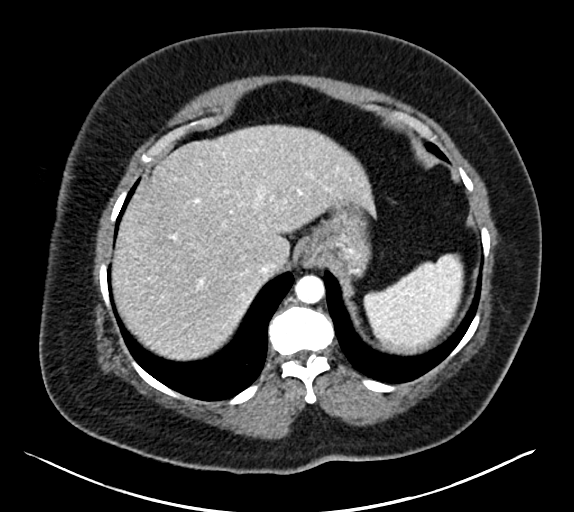
[im 86/119  soft-tissue]
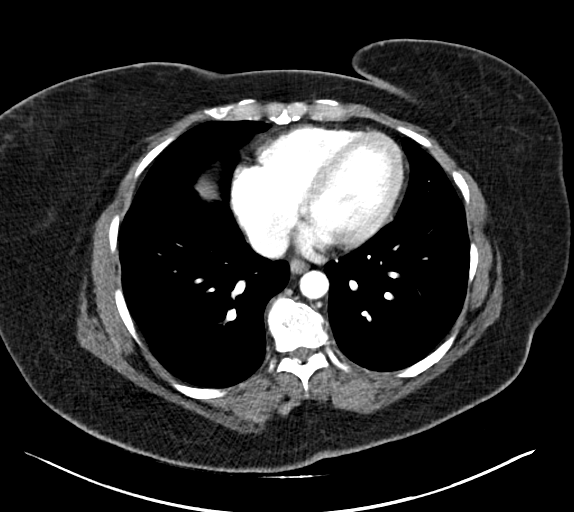
[im 97/119  soft-tissue]
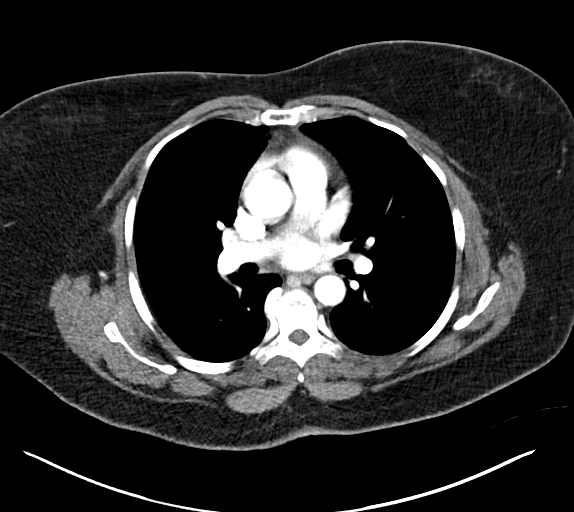
[im 97/119  bone]
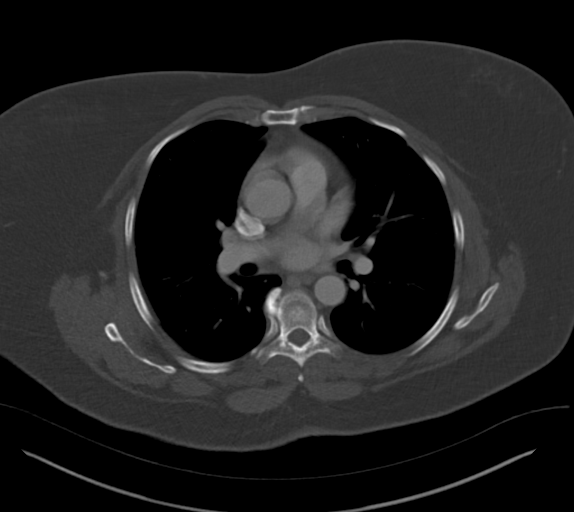
[im 108/119  soft-tissue]
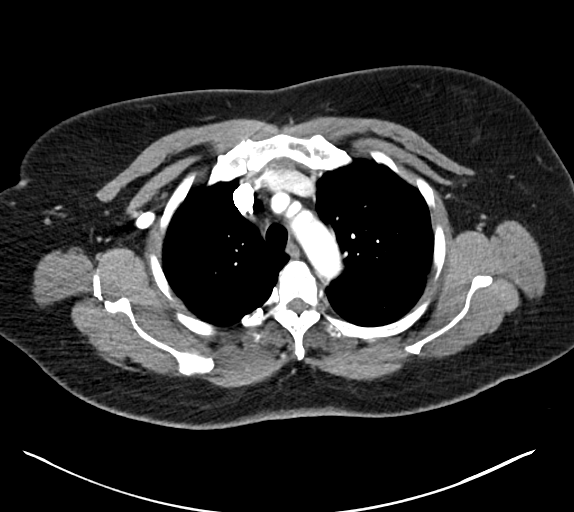

[Series 6: cap with 2.00 br40 s3 cor · coronal · 0.79mm/px · 3 of 181 slices shown]
[im 61/181  soft-tissue]
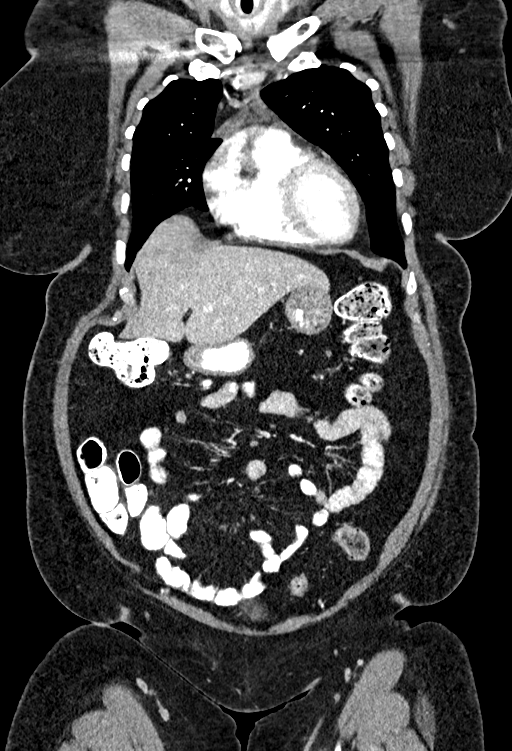
[im 81/181  soft-tissue]
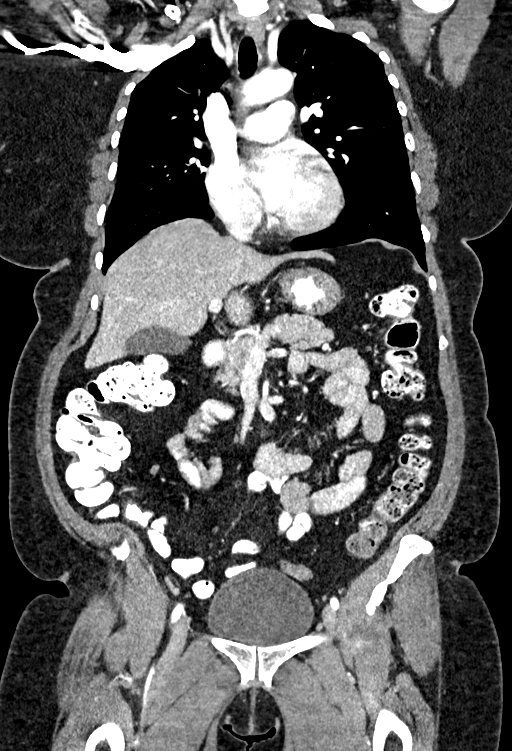
[im 101/181  soft-tissue]
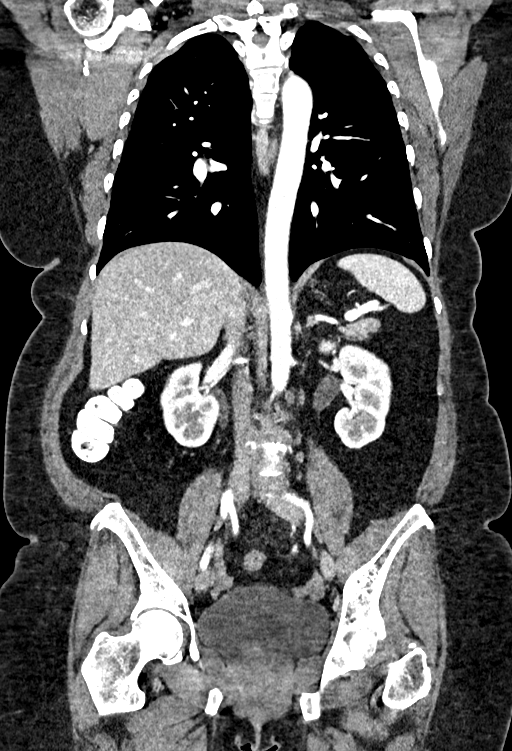

[13 of 46 positions shown; findings below may reference images not displayed]

FINDINGS: CT CHEST FINDINGS

Cardiovascular: The heart size is normal. No substantial pericardial
effusion. No thoracic aortic aneurysm.

Mediastinum/Nodes: No mediastinal lymphadenopathy. There is no hilar
lymphadenopathy. Tiny hiatal hernia. The esophagus has normal
imaging features. There is no axillary lymphadenopathy.

Lungs/Pleura: No suspicious pulmonary nodule or mass. No focal
airspace consolidation. No pleural effusion.

Musculoskeletal: No worrisome lytic or sclerotic osseous
abnormality.

CT ABDOMEN PELVIS FINDINGS

Hepatobiliary: 3 tiny hypodensities identified in the hepatic
parenchyma are stable since 06/16/2015 consistent with benign
etiology. There is no evidence for gallstones, gallbladder wall
thickening, or pericholecystic fluid. No intrahepatic or
extrahepatic biliary dilation.

Pancreas: No focal mass lesion. No dilatation of the main duct. No
intraparenchymal cyst. No peripancreatic edema.

Spleen: No splenomegaly. No focal mass lesion.

Adrenals/Urinary Tract: No adrenal nodule or mass. 3-4 mm
nonobstructing stone identified upper interpolar left kidney (image
118 coronal series 6). 7 mm hypoattenuating lesion upper pole right
kidney is stable since 0566, compatible with tiny cyst. No evidence
for hydroureter. The urinary bladder appears normal for the degree
of distention.

Stomach/Bowel: Tiny hiatal hernia. Stomach otherwise unremarkable.
Duodenum is normally positioned as is the ligament of Treitz. No
small bowel wall thickening. No small bowel dilatation. The terminal
ileum is normal. The appendix is best seen on coronal images and is
unremarkable. No gross colonic mass. No colonic wall thickening.

Vascular/Lymphatic: No abdominal aortic aneurysm. No abdominal
aortic atherosclerotic calcification. There is no gastrohepatic or
hepatoduodenal ligament lymphadenopathy. No retroperitoneal or
mesenteric lymphadenopathy. No pelvic sidewall lymphadenopathy.

Reproductive: The uterus is unremarkable.  There is no adnexal mass.

Other: No intraperitoneal free fluid.

Musculoskeletal: No worrisome lytic or sclerotic osseous
abnormality.
IMPRESSION: 1. No acute findings in the chest, abdomen, or pelvis. Specifically,
no findings to explain the patient's history of chronic chest and
abdominal pain.
2. 3-4 mm nonobstructing left renal stone.
3. Tiny hiatal hernia.

## 2021-04-30 ENCOUNTER — Other Ambulatory Visit (HOSPITAL_COMMUNITY): Payer: Self-pay

## 2021-04-30 DIAGNOSIS — Z01118 Encounter for examination of ears and hearing with other abnormal findings: Secondary | ICD-10-CM | POA: Diagnosis not present

## 2021-04-30 DIAGNOSIS — E782 Mixed hyperlipidemia: Secondary | ICD-10-CM | POA: Diagnosis not present

## 2021-04-30 DIAGNOSIS — Z131 Encounter for screening for diabetes mellitus: Secondary | ICD-10-CM | POA: Diagnosis not present

## 2021-04-30 DIAGNOSIS — R1013 Epigastric pain: Secondary | ICD-10-CM | POA: Diagnosis not present

## 2021-04-30 DIAGNOSIS — Z01021 Encounter for examination of eyes and vision following failed vision screening with abnormal findings: Secondary | ICD-10-CM | POA: Diagnosis not present

## 2021-04-30 DIAGNOSIS — Z136 Encounter for screening for cardiovascular disorders: Secondary | ICD-10-CM | POA: Diagnosis not present

## 2021-04-30 DIAGNOSIS — E559 Vitamin D deficiency, unspecified: Secondary | ICD-10-CM | POA: Diagnosis not present

## 2021-04-30 DIAGNOSIS — E6609 Other obesity due to excess calories: Secondary | ICD-10-CM | POA: Diagnosis not present

## 2021-04-30 DIAGNOSIS — Z1329 Encounter for screening for other suspected endocrine disorder: Secondary | ICD-10-CM | POA: Diagnosis not present

## 2021-04-30 DIAGNOSIS — Z0001 Encounter for general adult medical examination with abnormal findings: Secondary | ICD-10-CM | POA: Diagnosis not present

## 2021-04-30 MED ORDER — SAXENDA 18 MG/3ML ~~LOC~~ SOPN
0.6000 mg | PEN_INJECTOR | Freq: Every day | SUBCUTANEOUS | 2 refills | Status: DC
  Filled 2021-04-30: qty 3, 28d supply, fill #0
  Filled 2021-05-11 – 2021-06-14 (×4): qty 3, 30d supply, fill #0
  Filled 2021-07-16: qty 3, 30d supply, fill #1
  Filled 2021-08-20: qty 3, 30d supply, fill #2
  Filled 2021-12-24: qty 3, 30d supply, fill #3

## 2021-05-01 ENCOUNTER — Other Ambulatory Visit (HOSPITAL_COMMUNITY): Payer: Self-pay

## 2021-05-02 ENCOUNTER — Other Ambulatory Visit (HOSPITAL_COMMUNITY): Payer: Self-pay

## 2021-05-11 ENCOUNTER — Other Ambulatory Visit (HOSPITAL_COMMUNITY): Payer: Self-pay

## 2021-05-16 ENCOUNTER — Other Ambulatory Visit (HOSPITAL_COMMUNITY): Payer: Self-pay

## 2021-05-23 ENCOUNTER — Other Ambulatory Visit (HOSPITAL_COMMUNITY): Payer: Self-pay

## 2021-05-25 ENCOUNTER — Other Ambulatory Visit (HOSPITAL_COMMUNITY): Payer: Self-pay

## 2021-06-01 ENCOUNTER — Other Ambulatory Visit (HOSPITAL_COMMUNITY): Payer: Self-pay

## 2021-06-08 ENCOUNTER — Other Ambulatory Visit (HOSPITAL_COMMUNITY): Payer: Self-pay

## 2021-06-08 DIAGNOSIS — E559 Vitamin D deficiency, unspecified: Secondary | ICD-10-CM | POA: Diagnosis not present

## 2021-06-08 DIAGNOSIS — E782 Mixed hyperlipidemia: Secondary | ICD-10-CM | POA: Diagnosis not present

## 2021-06-08 DIAGNOSIS — E6609 Other obesity due to excess calories: Secondary | ICD-10-CM | POA: Diagnosis not present

## 2021-06-08 DIAGNOSIS — R1013 Epigastric pain: Secondary | ICD-10-CM | POA: Diagnosis not present

## 2021-06-08 MED ORDER — ROSUVASTATIN CALCIUM 20 MG PO TABS
20.0000 mg | ORAL_TABLET | Freq: Every day | ORAL | 2 refills | Status: DC
  Filled 2021-06-08: qty 30, 30d supply, fill #0

## 2021-06-11 ENCOUNTER — Other Ambulatory Visit (HOSPITAL_COMMUNITY): Payer: Self-pay

## 2021-06-11 MED ORDER — SAXENDA 18 MG/3ML ~~LOC~~ SOPN
PEN_INJECTOR | SUBCUTANEOUS | 0 refills | Status: DC
  Filled 2021-06-11: qty 15, 30d supply, fill #0

## 2021-06-12 ENCOUNTER — Other Ambulatory Visit (HOSPITAL_COMMUNITY): Payer: Self-pay

## 2021-06-13 ENCOUNTER — Other Ambulatory Visit (HOSPITAL_COMMUNITY): Payer: Self-pay

## 2021-06-13 MED ORDER — INSULIN PEN NEEDLE 32G X 4 MM MISC
2 refills | Status: DC
  Filled 2021-06-13: qty 100, 90d supply, fill #0
  Filled 2021-09-20: qty 100, 90d supply, fill #1
  Filled 2022-03-13: qty 100, 90d supply, fill #2

## 2021-06-14 ENCOUNTER — Other Ambulatory Visit (HOSPITAL_COMMUNITY): Payer: Self-pay

## 2021-07-16 ENCOUNTER — Other Ambulatory Visit (HOSPITAL_COMMUNITY): Payer: Self-pay

## 2021-08-20 ENCOUNTER — Other Ambulatory Visit (HOSPITAL_COMMUNITY): Payer: Self-pay

## 2021-08-21 ENCOUNTER — Other Ambulatory Visit (HOSPITAL_COMMUNITY): Payer: Self-pay

## 2021-08-28 ENCOUNTER — Other Ambulatory Visit (HOSPITAL_COMMUNITY): Payer: Self-pay

## 2021-09-07 ENCOUNTER — Other Ambulatory Visit (HOSPITAL_COMMUNITY): Payer: Self-pay

## 2021-09-07 DIAGNOSIS — E559 Vitamin D deficiency, unspecified: Secondary | ICD-10-CM | POA: Diagnosis not present

## 2021-09-07 DIAGNOSIS — R1013 Epigastric pain: Secondary | ICD-10-CM | POA: Diagnosis not present

## 2021-09-07 DIAGNOSIS — E782 Mixed hyperlipidemia: Secondary | ICD-10-CM | POA: Diagnosis not present

## 2021-09-07 DIAGNOSIS — E6609 Other obesity due to excess calories: Secondary | ICD-10-CM | POA: Diagnosis not present

## 2021-09-07 MED ORDER — SAXENDA 18 MG/3ML ~~LOC~~ SOPN
1.2000 mg | PEN_INJECTOR | Freq: Every day | SUBCUTANEOUS | 0 refills | Status: DC
  Filled 2021-09-07 – 2021-09-19 (×2): qty 15, 75d supply, fill #0

## 2021-09-19 ENCOUNTER — Other Ambulatory Visit (HOSPITAL_COMMUNITY): Payer: Self-pay

## 2021-09-20 ENCOUNTER — Other Ambulatory Visit (HOSPITAL_COMMUNITY): Payer: Self-pay

## 2021-09-21 ENCOUNTER — Other Ambulatory Visit (HOSPITAL_COMMUNITY): Payer: Self-pay

## 2021-10-17 DIAGNOSIS — N8189 Other female genital prolapse: Secondary | ICD-10-CM | POA: Diagnosis not present

## 2021-10-17 DIAGNOSIS — Z1231 Encounter for screening mammogram for malignant neoplasm of breast: Secondary | ICD-10-CM | POA: Diagnosis not present

## 2021-10-17 DIAGNOSIS — Z01419 Encounter for gynecological examination (general) (routine) without abnormal findings: Secondary | ICD-10-CM | POA: Diagnosis not present

## 2021-12-07 ENCOUNTER — Other Ambulatory Visit (HOSPITAL_COMMUNITY): Payer: Self-pay

## 2021-12-10 ENCOUNTER — Other Ambulatory Visit (HOSPITAL_COMMUNITY): Payer: Self-pay

## 2021-12-11 ENCOUNTER — Other Ambulatory Visit (HOSPITAL_COMMUNITY): Payer: Self-pay

## 2021-12-12 ENCOUNTER — Other Ambulatory Visit (HOSPITAL_COMMUNITY): Payer: Self-pay

## 2021-12-18 ENCOUNTER — Other Ambulatory Visit (HOSPITAL_COMMUNITY): Payer: Self-pay

## 2021-12-24 ENCOUNTER — Other Ambulatory Visit (HOSPITAL_COMMUNITY): Payer: Self-pay

## 2021-12-28 ENCOUNTER — Other Ambulatory Visit (HOSPITAL_COMMUNITY): Payer: Self-pay

## 2021-12-31 ENCOUNTER — Other Ambulatory Visit (HOSPITAL_COMMUNITY): Payer: Self-pay

## 2021-12-31 MED ORDER — SAXENDA 18 MG/3ML ~~LOC~~ SOPN
3.0000 mg | PEN_INJECTOR | Freq: Every day | SUBCUTANEOUS | 1 refills | Status: DC
  Filled 2021-12-31 – 2022-03-13 (×5): qty 15, 30d supply, fill #0
  Filled 2022-04-19: qty 15, 30d supply, fill #1

## 2022-01-01 ENCOUNTER — Other Ambulatory Visit (HOSPITAL_COMMUNITY): Payer: Self-pay

## 2022-01-03 ENCOUNTER — Other Ambulatory Visit (HOSPITAL_COMMUNITY): Payer: Self-pay

## 2022-01-16 ENCOUNTER — Other Ambulatory Visit (HOSPITAL_COMMUNITY): Payer: Self-pay

## 2022-01-28 ENCOUNTER — Other Ambulatory Visit (HOSPITAL_COMMUNITY): Payer: Self-pay

## 2022-03-13 ENCOUNTER — Other Ambulatory Visit (HOSPITAL_COMMUNITY): Payer: Self-pay

## 2022-03-14 ENCOUNTER — Other Ambulatory Visit (HOSPITAL_COMMUNITY): Payer: Self-pay

## 2022-04-12 ENCOUNTER — Other Ambulatory Visit (HOSPITAL_COMMUNITY): Payer: Self-pay

## 2022-04-12 DIAGNOSIS — E6609 Other obesity due to excess calories: Secondary | ICD-10-CM | POA: Diagnosis not present

## 2022-04-12 DIAGNOSIS — E559 Vitamin D deficiency, unspecified: Secondary | ICD-10-CM | POA: Diagnosis not present

## 2022-04-12 DIAGNOSIS — M546 Pain in thoracic spine: Secondary | ICD-10-CM | POA: Diagnosis not present

## 2022-04-12 DIAGNOSIS — R1013 Epigastric pain: Secondary | ICD-10-CM | POA: Diagnosis not present

## 2022-04-12 DIAGNOSIS — E782 Mixed hyperlipidemia: Secondary | ICD-10-CM | POA: Diagnosis not present

## 2022-04-12 DIAGNOSIS — R0781 Pleurodynia: Secondary | ICD-10-CM | POA: Diagnosis not present

## 2022-04-12 MED ORDER — DICLOFENAC SODIUM 3 % EX GEL
1.0000 "application " | Freq: Two times a day (BID) | CUTANEOUS | 0 refills | Status: DC
  Filled 2022-04-12: qty 100, 30d supply, fill #0

## 2022-04-12 MED ORDER — DICLOFENAC SODIUM 1 % EX GEL
Freq: Four times a day (QID) | CUTANEOUS | 0 refills | Status: DC
  Filled 2022-04-12: qty 100, 14d supply, fill #0

## 2022-04-12 MED ORDER — PANTOPRAZOLE SODIUM 40 MG PO TBEC
40.0000 mg | DELAYED_RELEASE_TABLET | Freq: Two times a day (BID) | ORAL | 1 refills | Status: DC
  Filled 2022-04-12: qty 180, 90d supply, fill #0

## 2022-04-12 MED ORDER — VITAMIN D3 125 MCG (5000 UT) PO CAPS
1.0000 | ORAL_CAPSULE | Freq: Every day | ORAL | 2 refills | Status: DC
  Filled 2022-04-12: qty 30, 30d supply, fill #0

## 2022-04-12 MED ORDER — ROSUVASTATIN CALCIUM 20 MG PO TABS
20.0000 mg | ORAL_TABLET | Freq: Every day | ORAL | 2 refills | Status: DC
  Filled 2022-04-12: qty 30, 30d supply, fill #0

## 2022-04-19 ENCOUNTER — Other Ambulatory Visit (HOSPITAL_COMMUNITY): Payer: Self-pay

## 2022-04-26 ENCOUNTER — Other Ambulatory Visit (HOSPITAL_COMMUNITY): Payer: Self-pay

## 2022-04-26 DIAGNOSIS — M5414 Radiculopathy, thoracic region: Secondary | ICD-10-CM | POA: Diagnosis not present

## 2022-04-26 MED ORDER — NAPROXEN 500 MG PO TABS
500.0000 mg | ORAL_TABLET | Freq: Two times a day (BID) | ORAL | 1 refills | Status: DC | PRN
  Filled 2022-04-26: qty 60, 30d supply, fill #0

## 2022-05-01 DIAGNOSIS — M546 Pain in thoracic spine: Secondary | ICD-10-CM | POA: Diagnosis not present

## 2022-05-02 DIAGNOSIS — M546 Pain in thoracic spine: Secondary | ICD-10-CM | POA: Diagnosis not present

## 2022-05-02 DIAGNOSIS — M5414 Radiculopathy, thoracic region: Secondary | ICD-10-CM | POA: Diagnosis not present

## 2022-05-22 ENCOUNTER — Other Ambulatory Visit (HOSPITAL_COMMUNITY): Payer: Self-pay

## 2022-05-24 ENCOUNTER — Other Ambulatory Visit (HOSPITAL_COMMUNITY): Payer: Self-pay

## 2022-05-24 DIAGNOSIS — E782 Mixed hyperlipidemia: Secondary | ICD-10-CM | POA: Diagnosis not present

## 2022-05-24 DIAGNOSIS — E6609 Other obesity due to excess calories: Secondary | ICD-10-CM | POA: Diagnosis not present

## 2022-05-24 DIAGNOSIS — E559 Vitamin D deficiency, unspecified: Secondary | ICD-10-CM | POA: Diagnosis not present

## 2022-05-24 DIAGNOSIS — R1013 Epigastric pain: Secondary | ICD-10-CM | POA: Diagnosis not present

## 2022-05-24 MED ORDER — SAXENDA 18 MG/3ML ~~LOC~~ SOPN
0.6000 mg | PEN_INJECTOR | Freq: Every day | SUBCUTANEOUS | 5 refills | Status: DC
  Filled 2022-05-27: qty 12, 30d supply, fill #0
  Filled 2022-06-06: qty 3, 30d supply, fill #0
  Filled 2022-06-06: qty 15, 30d supply, fill #0
  Filled 2022-06-18 – 2022-06-20 (×2): qty 12, 120d supply, fill #0
  Filled 2022-09-02: qty 12, 90d supply, fill #0
  Filled 2022-10-03: qty 15, 150d supply, fill #0
  Filled 2022-12-12 – 2023-01-28 (×2): qty 12, 120d supply, fill #0

## 2022-05-27 ENCOUNTER — Other Ambulatory Visit (HOSPITAL_COMMUNITY): Payer: Self-pay

## 2022-05-28 ENCOUNTER — Other Ambulatory Visit (HOSPITAL_COMMUNITY): Payer: Self-pay

## 2022-05-29 ENCOUNTER — Other Ambulatory Visit (HOSPITAL_COMMUNITY): Payer: Self-pay

## 2022-05-31 ENCOUNTER — Other Ambulatory Visit (HOSPITAL_COMMUNITY): Payer: Self-pay

## 2022-06-03 ENCOUNTER — Other Ambulatory Visit (HOSPITAL_COMMUNITY): Payer: Self-pay

## 2022-06-06 ENCOUNTER — Other Ambulatory Visit (HOSPITAL_COMMUNITY): Payer: Self-pay

## 2022-06-07 ENCOUNTER — Other Ambulatory Visit (HOSPITAL_COMMUNITY): Payer: Self-pay

## 2022-06-11 ENCOUNTER — Other Ambulatory Visit (HOSPITAL_COMMUNITY): Payer: Self-pay

## 2022-06-13 ENCOUNTER — Other Ambulatory Visit (HOSPITAL_COMMUNITY): Payer: Self-pay

## 2022-06-13 MED ORDER — SAXENDA 18 MG/3ML ~~LOC~~ SOPN
0.6000 mg | PEN_INJECTOR | Freq: Every day | SUBCUTANEOUS | 0 refills | Status: DC
  Filled 2022-06-13: qty 3, 30d supply, fill #0

## 2022-06-14 ENCOUNTER — Other Ambulatory Visit (HOSPITAL_COMMUNITY): Payer: Self-pay

## 2022-06-18 ENCOUNTER — Other Ambulatory Visit (HOSPITAL_COMMUNITY): Payer: Self-pay

## 2022-06-20 ENCOUNTER — Other Ambulatory Visit (HOSPITAL_COMMUNITY): Payer: Self-pay

## 2022-06-21 ENCOUNTER — Other Ambulatory Visit (HOSPITAL_COMMUNITY): Payer: Self-pay

## 2022-06-21 DIAGNOSIS — M9902 Segmental and somatic dysfunction of thoracic region: Secondary | ICD-10-CM | POA: Diagnosis not present

## 2022-06-21 DIAGNOSIS — S335XXA Sprain of ligaments of lumbar spine, initial encounter: Secondary | ICD-10-CM | POA: Diagnosis not present

## 2022-06-21 DIAGNOSIS — S233XXA Sprain of ligaments of thoracic spine, initial encounter: Secondary | ICD-10-CM | POA: Diagnosis not present

## 2022-06-21 DIAGNOSIS — M9903 Segmental and somatic dysfunction of lumbar region: Secondary | ICD-10-CM | POA: Diagnosis not present

## 2022-06-21 DIAGNOSIS — M62830 Muscle spasm of back: Secondary | ICD-10-CM | POA: Diagnosis not present

## 2022-06-24 ENCOUNTER — Other Ambulatory Visit (HOSPITAL_COMMUNITY): Payer: Self-pay

## 2022-06-25 ENCOUNTER — Other Ambulatory Visit (HOSPITAL_COMMUNITY): Payer: Self-pay

## 2022-06-26 ENCOUNTER — Other Ambulatory Visit (HOSPITAL_COMMUNITY): Payer: Self-pay

## 2022-06-26 MED ORDER — SAXENDA 18 MG/3ML ~~LOC~~ SOPN
3.0000 mg | PEN_INJECTOR | Freq: Every day | SUBCUTANEOUS | 5 refills | Status: DC
  Filled 2022-06-26: qty 15, 30d supply, fill #0
  Filled 2022-07-23: qty 15, 30d supply, fill #1
  Filled 2022-09-02: qty 15, 30d supply, fill #2
  Filled 2022-10-03: qty 15, 30d supply, fill #3
  Filled 2022-11-19 – 2022-12-12 (×4): qty 15, 30d supply, fill #4

## 2022-06-27 ENCOUNTER — Other Ambulatory Visit (HOSPITAL_COMMUNITY): Payer: Self-pay

## 2022-07-10 DIAGNOSIS — M9903 Segmental and somatic dysfunction of lumbar region: Secondary | ICD-10-CM | POA: Diagnosis not present

## 2022-07-10 DIAGNOSIS — S335XXA Sprain of ligaments of lumbar spine, initial encounter: Secondary | ICD-10-CM | POA: Diagnosis not present

## 2022-07-10 DIAGNOSIS — M9902 Segmental and somatic dysfunction of thoracic region: Secondary | ICD-10-CM | POA: Diagnosis not present

## 2022-07-10 DIAGNOSIS — S233XXA Sprain of ligaments of thoracic spine, initial encounter: Secondary | ICD-10-CM | POA: Diagnosis not present

## 2022-07-10 DIAGNOSIS — M62830 Muscle spasm of back: Secondary | ICD-10-CM | POA: Diagnosis not present

## 2022-07-23 ENCOUNTER — Other Ambulatory Visit (HOSPITAL_COMMUNITY): Payer: Self-pay

## 2022-07-29 ENCOUNTER — Other Ambulatory Visit (HOSPITAL_COMMUNITY): Payer: Self-pay

## 2022-08-01 ENCOUNTER — Other Ambulatory Visit (HOSPITAL_COMMUNITY): Payer: Self-pay

## 2022-09-02 ENCOUNTER — Other Ambulatory Visit (HOSPITAL_COMMUNITY): Payer: Self-pay

## 2022-09-03 ENCOUNTER — Other Ambulatory Visit (HOSPITAL_COMMUNITY): Payer: Self-pay

## 2022-09-04 ENCOUNTER — Other Ambulatory Visit: Payer: Self-pay | Admitting: Internal Medicine

## 2022-09-18 ENCOUNTER — Other Ambulatory Visit (HOSPITAL_COMMUNITY): Payer: Self-pay

## 2022-10-03 ENCOUNTER — Other Ambulatory Visit (HOSPITAL_COMMUNITY): Payer: Self-pay

## 2022-10-09 ENCOUNTER — Other Ambulatory Visit (HOSPITAL_COMMUNITY): Payer: Self-pay

## 2022-10-22 DIAGNOSIS — Z6832 Body mass index (BMI) 32.0-32.9, adult: Secondary | ICD-10-CM | POA: Diagnosis not present

## 2022-10-22 DIAGNOSIS — Z1239 Encounter for other screening for malignant neoplasm of breast: Secondary | ICD-10-CM | POA: Diagnosis not present

## 2022-10-22 DIAGNOSIS — Z139 Encounter for screening, unspecified: Secondary | ICD-10-CM | POA: Diagnosis not present

## 2022-10-22 DIAGNOSIS — D259 Leiomyoma of uterus, unspecified: Secondary | ICD-10-CM | POA: Diagnosis not present

## 2022-10-22 DIAGNOSIS — N951 Menopausal and female climacteric states: Secondary | ICD-10-CM | POA: Diagnosis not present

## 2022-10-22 DIAGNOSIS — Z1231 Encounter for screening mammogram for malignant neoplasm of breast: Secondary | ICD-10-CM | POA: Diagnosis not present

## 2022-10-22 DIAGNOSIS — Z01419 Encounter for gynecological examination (general) (routine) without abnormal findings: Secondary | ICD-10-CM | POA: Diagnosis not present

## 2022-10-22 DIAGNOSIS — Z124 Encounter for screening for malignant neoplasm of cervix: Secondary | ICD-10-CM | POA: Diagnosis not present

## 2022-10-22 DIAGNOSIS — N750 Cyst of Bartholin's gland: Secondary | ICD-10-CM | POA: Diagnosis not present

## 2022-11-19 ENCOUNTER — Other Ambulatory Visit (HOSPITAL_COMMUNITY): Payer: Self-pay

## 2022-11-27 ENCOUNTER — Other Ambulatory Visit (HOSPITAL_COMMUNITY): Payer: Self-pay

## 2022-11-27 DIAGNOSIS — R1013 Epigastric pain: Secondary | ICD-10-CM | POA: Diagnosis not present

## 2022-11-27 DIAGNOSIS — R079 Chest pain, unspecified: Secondary | ICD-10-CM | POA: Diagnosis not present

## 2022-11-27 DIAGNOSIS — E782 Mixed hyperlipidemia: Secondary | ICD-10-CM | POA: Diagnosis not present

## 2022-11-27 DIAGNOSIS — E559 Vitamin D deficiency, unspecified: Secondary | ICD-10-CM | POA: Diagnosis not present

## 2022-11-27 MED ORDER — PANTOPRAZOLE SODIUM 40 MG PO TBEC
40.0000 mg | DELAYED_RELEASE_TABLET | Freq: Two times a day (BID) | ORAL | 0 refills | Status: DC
  Filled 2022-11-27: qty 60, 30d supply, fill #0

## 2022-11-27 MED ORDER — SUCRALFATE 1 G PO TABS
1.0000 g | ORAL_TABLET | Freq: Three times a day (TID) | ORAL | 0 refills | Status: DC
  Filled 2022-11-27: qty 120, 30d supply, fill #0

## 2022-11-28 ENCOUNTER — Other Ambulatory Visit (HOSPITAL_COMMUNITY): Payer: Self-pay

## 2022-11-29 ENCOUNTER — Other Ambulatory Visit (HOSPITAL_COMMUNITY): Payer: Self-pay

## 2022-11-29 MED ORDER — SM VITAMIN D3 125 MCG (5000 UT) PO TABS
1.0000 | ORAL_TABLET | Freq: Every day | ORAL | 2 refills | Status: DC
  Filled 2022-11-29: qty 30, 30d supply, fill #0

## 2022-12-11 DIAGNOSIS — E782 Mixed hyperlipidemia: Secondary | ICD-10-CM | POA: Diagnosis not present

## 2022-12-11 DIAGNOSIS — R079 Chest pain, unspecified: Secondary | ICD-10-CM | POA: Diagnosis not present

## 2022-12-12 ENCOUNTER — Other Ambulatory Visit (HOSPITAL_BASED_OUTPATIENT_CLINIC_OR_DEPARTMENT_OTHER): Payer: Self-pay

## 2022-12-12 ENCOUNTER — Other Ambulatory Visit (HOSPITAL_COMMUNITY): Payer: Self-pay

## 2023-01-08 DIAGNOSIS — E559 Vitamin D deficiency, unspecified: Secondary | ICD-10-CM | POA: Diagnosis not present

## 2023-01-08 DIAGNOSIS — E782 Mixed hyperlipidemia: Secondary | ICD-10-CM | POA: Diagnosis not present

## 2023-01-08 DIAGNOSIS — R1013 Epigastric pain: Secondary | ICD-10-CM | POA: Diagnosis not present

## 2023-01-28 ENCOUNTER — Other Ambulatory Visit (HOSPITAL_COMMUNITY): Payer: Self-pay

## 2023-03-07 ENCOUNTER — Other Ambulatory Visit (HOSPITAL_COMMUNITY): Payer: Self-pay

## 2023-03-07 MED ORDER — SAXENDA 18 MG/3ML ~~LOC~~ SOPN
3.0000 mg | PEN_INJECTOR | Freq: Every day | SUBCUTANEOUS | 5 refills | Status: DC
  Filled 2023-03-07 – 2023-08-18 (×3): qty 15, 30d supply, fill #0

## 2023-03-10 ENCOUNTER — Other Ambulatory Visit (HOSPITAL_COMMUNITY): Payer: Self-pay

## 2023-03-14 ENCOUNTER — Other Ambulatory Visit: Payer: Self-pay

## 2023-03-19 ENCOUNTER — Other Ambulatory Visit (HOSPITAL_COMMUNITY): Payer: Self-pay

## 2023-03-19 MED ORDER — WEGOVY 0.25 MG/0.5ML ~~LOC~~ SOAJ
0.2500 mg | SUBCUTANEOUS | 1 refills | Status: DC
  Filled 2023-03-19: qty 2, 28d supply, fill #0

## 2023-03-25 ENCOUNTER — Other Ambulatory Visit (HOSPITAL_COMMUNITY): Payer: Self-pay

## 2023-04-01 ENCOUNTER — Other Ambulatory Visit (HOSPITAL_COMMUNITY): Payer: Self-pay

## 2023-04-17 ENCOUNTER — Other Ambulatory Visit (HOSPITAL_COMMUNITY): Payer: Self-pay

## 2023-06-11 DIAGNOSIS — E782 Mixed hyperlipidemia: Secondary | ICD-10-CM | POA: Diagnosis not present

## 2023-06-11 DIAGNOSIS — Z0001 Encounter for general adult medical examination with abnormal findings: Secondary | ICD-10-CM | POA: Diagnosis not present

## 2023-06-11 DIAGNOSIS — Z131 Encounter for screening for diabetes mellitus: Secondary | ICD-10-CM | POA: Diagnosis not present

## 2023-06-11 DIAGNOSIS — R1013 Epigastric pain: Secondary | ICD-10-CM | POA: Diagnosis not present

## 2023-06-11 DIAGNOSIS — Z1329 Encounter for screening for other suspected endocrine disorder: Secondary | ICD-10-CM | POA: Diagnosis not present

## 2023-06-11 DIAGNOSIS — E559 Vitamin D deficiency, unspecified: Secondary | ICD-10-CM | POA: Diagnosis not present

## 2023-06-12 LAB — LAB REPORT - SCANNED
A1c: 5.3
EGFR: 106
PSA, Total: 0.04

## 2023-08-15 ENCOUNTER — Other Ambulatory Visit (HOSPITAL_COMMUNITY): Payer: Self-pay

## 2023-08-15 DIAGNOSIS — Z6835 Body mass index (BMI) 35.0-35.9, adult: Secondary | ICD-10-CM | POA: Diagnosis not present

## 2023-08-15 DIAGNOSIS — M546 Pain in thoracic spine: Secondary | ICD-10-CM | POA: Diagnosis not present

## 2023-08-15 DIAGNOSIS — R0789 Other chest pain: Secondary | ICD-10-CM | POA: Diagnosis not present

## 2023-08-15 DIAGNOSIS — E6609 Other obesity due to excess calories: Secondary | ICD-10-CM | POA: Diagnosis not present

## 2023-08-15 MED ORDER — TIZANIDINE HCL 2 MG PO TABS
2.0000 mg | ORAL_TABLET | Freq: Three times a day (TID) | ORAL | 0 refills | Status: DC | PRN
  Filled 2023-08-15: qty 30, 5d supply, fill #0

## 2023-08-18 ENCOUNTER — Other Ambulatory Visit (HOSPITAL_COMMUNITY): Payer: Self-pay

## 2023-09-12 DIAGNOSIS — E782 Mixed hyperlipidemia: Secondary | ICD-10-CM | POA: Diagnosis not present

## 2023-09-12 DIAGNOSIS — E6609 Other obesity due to excess calories: Secondary | ICD-10-CM | POA: Diagnosis not present

## 2023-09-12 DIAGNOSIS — E559 Vitamin D deficiency, unspecified: Secondary | ICD-10-CM | POA: Diagnosis not present

## 2023-09-12 DIAGNOSIS — Z6835 Body mass index (BMI) 35.0-35.9, adult: Secondary | ICD-10-CM | POA: Diagnosis not present

## 2023-09-12 DIAGNOSIS — R1013 Epigastric pain: Secondary | ICD-10-CM | POA: Diagnosis not present

## 2023-10-02 ENCOUNTER — Ambulatory Visit: Payer: 59 | Admitting: Podiatry

## 2023-10-02 ENCOUNTER — Encounter: Payer: Self-pay | Admitting: Podiatry

## 2023-10-02 DIAGNOSIS — B351 Tinea unguium: Secondary | ICD-10-CM

## 2023-10-02 DIAGNOSIS — M722 Plantar fascial fibromatosis: Secondary | ICD-10-CM | POA: Diagnosis not present

## 2023-10-02 MED ORDER — EFINACONAZOLE 10 % EX SOLN: 1.0000 [drp] | Freq: Every day | CUTANEOUS | 11 refills | Status: DC

## 2023-10-02 NOTE — Progress Notes (Signed)
  Subjective:  Patient ID: Leslie Kirby, female    DOB: July 09, 1971,  MRN: [id number]  Chief Complaint  Patient presents with   Nail Problem    Patient states has nail fungus right big toe/heel pain left.    Discussed the use of AI scribe software for clinical note transcription with the patient, who gave verbal consent to proceed.  History of Present Illness   The patient, with a history of plantar fasciitis and onychomycosis, presents with a recurrence of heel pain and a new complaint of nail fungus. The nail fungus, located on the right big toe, has been present for approximately a year and has not been previously treated. Despite the patient's attempts to clean the area, the fungus appears to regrow. The patient also reports a recurrence of heel pain, primarily on the left side, which has been a recurring issue for several years. 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 The patient has been using custom molded orthotics provided approximately five years ago and is unsure of her current condition.          Objective:    Physical Exam   MUSCULOSKELETAL: Discoloration of multiple toenails, worst affected is the right great toenail with thickening, brown discoloration, and subungual debris. Plantar fasciitis at the left medial band insertion on the calcaneus. No pain upon palpation at the back of the heel and lateral aspect of the foot. No significant discomfort upon palpation of the plantar medial heel.       No images are attached to the encounter.    Results   Procedure: Injection of the left plantar fascial insertion Description: Following sterile prep with alcohol, the left medial band of the plantar fascial insertion on the calcaneus was injected with 0.5 cc of 2% lidocaine, 0.5% marcaine plain, 2 mg of dexamethasone, and 20 mg of Kenalog. She tolerated the procedure  well.      Assessment:   1. Onychomycosis   2. Plantar fasciitis of left foot      Plan:  Patient was evaluated and treated and all questions answered.  Assessment and Plan    Onychomycosis   She presents with thickening, brown discoloration, and subungual debris on the right great toenail. After discussing treatment options, including oral and topical antifungals, and laser treatment, she prefers topical treatment. We will prescribe efinaconazole (Jublia) through [facility] pharmacy.  Plantar Fasciitis   She experiences pain at the insertion of the plantar fascia medial band on the left foot, with a previous history of similar pain managed with corticosteroid injections and custom molded orthotics. Today, we administered a corticosteroid injection at the left plantar fascial insertion. We will initiate a home physical therapy plan with exercises to be done twice daily and advise her to inspect her current custom molded orthotics for wear and tear, considering refurbishment or replacement if necessary.          No follow-ups on file.

## 2023-10-02 NOTE — Patient Instructions (Signed)
VISIT SUMMARY:  Today, we addressed your recurring heel pain and a new complaint of nail fungus. We discussed treatment options for both conditions and provided immediate care for your heel pain.  YOUR PLAN:  -ONYCHOMYCOSIS: Onychomycosis is a fungal infection of the nail, causing thickening, discoloration, and debris under the nail. We discussed various treatment options, and you chose to use a topical antifungal medication. We have prescribed efinaconazole Hermine Messick) for you, which you can pick up from [facility].  -PLANTAR FASCIITIS: Plantar fasciitis is inflammation of the tissue along the bottom of your foot, causing heel pain. We administered a corticosteroid injection to help reduce the pain and inflammation. Additionally, we recommend you start a home physical therapy routine with exercises twice daily. Please check your custom orthotics for any wear and consider getting them refurbished or replaced if needed.  INSTRUCTIONS:  Please follow up with [place] if your symptoms do not improve or if you have any concerns. Make sure to pick up your prescription from [facility] and start your home physical therapy exercises as discussed. Inspect your custom orthotics and consider refurbishment or replacement if they are worn out.  Plantar Fasciitis (Heel Spur Syndrome) with Rehab The plantar fascia is a fibrous, ligament-like, soft-tissue structure that spans the bottom of the foot. Plantar fasciitis is a condition that causes pain in the foot due to inflammation of the tissue. SYMPTOMS  Pain and tenderness on the underneath side of the foot. Pain that worsens with standing or walking. CAUSES  Plantar fasciitis is caused by irritation and injury to the plantar fascia on the underneath side of the foot. Common mechanisms of injury include: Direct trauma to bottom of the foot. Damage to a small nerve that runs under the foot where the main fascia attaches to the heel bone. Stress placed  on the plantar fascia due to bone spurs. RISK INCREASES WITH:  Activities that place stress on the plantar fascia (running, jumping, pivoting, or cutting). Poor strength and flexibility. Improperly fitted shoes. Tight calf muscles. Flat feet. Failure to warm-up properly before activity. Obesity. PREVENTION Warm up and stretch properly before activity. Allow for adequate recovery between workouts. Maintain physical fitness: Strength, flexibility, and Rina. Cardiovascular fitness. Maintain a health body weight. Avoid stress on the plantar fascia. Wear properly fitted shoes, including arch supports for individuals who have flat feet.  PROGNOSIS  If treated properly, then the symptoms of plantar fasciitis usually resolve without surgery. However, occasionally surgery is necessary.  RELATED COMPLICATIONS  Recurrent symptoms that may result in a chronic condition. Problems of the lower back that are caused by compensating for the injury, such as limping. Pain or weakness of the foot during push-off following surgery. Chronic inflammation, scarring, and partial or complete fascia tear, occurring more often from repeated injections.  TREATMENT  Treatment initially involves the use of ice and medication to help reduce pain and inflammation. The use of strengthening and stretching exercises may help reduce pain with activity, especially stretches of the Achilles tendon. These exercises may be performed at home or with a therapist. Your caregiver may recommend that you use heel cups of arch supports to help reduce stress on the plantar fascia. Occasionally, corticosteroid injections are given to reduce inflammation. If symptoms persist for greater than 6 months despite non-surgical (conservative), then surgery may be recommended.   MEDICATION  If pain medication is necessary, then nonsteroidal anti-inflammatory medications, such as aspirin and ibuprofen, or other minor pain relievers, such  as acetaminophen, are often recommended.  Do not take pain medication within 7 days before surgery. Prescription pain relievers may be given if deemed necessary by your caregiver. Use only as directed and only as much as you need. Corticosteroid injections may be given by your caregiver. These injections should be reserved for the most serious cases, because they may only be given a certain number of times.  HEAT AND COLD Cold treatment (icing) relieves pain and reduces inflammation. Cold treatment should be applied for 10 to 15 minutes every 2 to 3 hours for inflammation and pain and immediately after any activity that aggravates your symptoms. Use ice packs or massage the area with a piece of ice (ice massage). Heat treatment may be used prior to performing the stretching and strengthening activities prescribed by your caregiver, physical therapist, or athletic trainer. Use a heat pack or soak the injury in warm water.  SEEK IMMEDIATE MEDICAL CARE IF: Treatment seems to offer no benefit, or the condition worsens. Any medications produce adverse side effects.  EXERCISES- RANGE OF MOTION (ROM) AND STRETCHING EXERCISES - Plantar Fasciitis (Heel Spur Syndrome) These exercises may help you when beginning to rehabilitate your injury. Your symptoms may resolve with or without further involvement from your physician, physical therapist or athletic trainer. While completing these exercises, remember:  Restoring tissue flexibility helps normal motion to return to the joints. This allows healthier, less painful movement and activity. An effective stretch should be held for at least 30 seconds. A stretch should never be painful. You should only feel a gentle lengthening or release in the stretched tissue.  RANGE OF MOTION - Toe Extension, Flexion Sit with your right / left leg crossed over your opposite knee. Grasp your toes and gently pull them back toward the top of your foot. You should feel a stretch  on the bottom of your toes and/or foot. Hold this stretch for 10 seconds. Now, gently pull your toes toward the bottom of your foot. You should feel a stretch on the top of your toes and or foot. Hold this stretch for 10 seconds. Repeat  times. Complete this stretch 3 times per day.   RANGE OF MOTION - Ankle Dorsiflexion, Active Assisted Remove shoes and sit on a chair that is preferably not on a carpeted surface. Place right / left foot under knee. Extend your opposite leg for support. Keeping your heel down, slide your right / left foot back toward the chair until you feel a stretch at your ankle or calf. If you do not feel a stretch, slide your bottom forward to the edge of the chair, while still keeping your heel down. Hold this stretch for 10 seconds. Repeat 3 times. Complete this stretch 2 times per day.   STRETCH  Gastroc, Standing Place hands on wall. Extend right / left leg, keeping the front knee somewhat bent. Slightly point your toes inward on your back foot. Keeping your right / left heel on the floor and your knee straight, shift your weight toward the wall, not allowing your back to arch. You should feel a gentle stretch in the right / left calf. Hold this position for 10 seconds. Repeat 3 times. Complete this stretch 2 times per day.  STRETCH  Soleus, Standing Place hands on wall. Extend right / left leg, keeping the other knee somewhat bent. Slightly point your toes inward on your back foot. Keep your right / left heel on the floor, bend your back knee, and slightly shift your weight over the back leg so  that you feel a gentle stretch deep in your back calf. Hold this position for 10 seconds. Repeat 3 times. Complete this stretch 2 times per day.  STRETCH  Gastrocsoleus, Standing  Note: This exercise can place a lot of stress on your foot and ankle. Please complete this exercise only if specifically instructed by your caregiver.  Place the ball of your right / left  foot on a step, keeping your other foot firmly on the same step. Hold on to the wall or a rail for balance. Slowly lift your other foot, allowing your body weight to press your heel down over the edge of the step. You should feel a stretch in your right / left calf. Hold this position for 10 seconds. Repeat this exercise with a slight bend in your right / left knee. Repeat 3 times. Complete this stretch 2 times per day.   STRENGTHENING EXERCISES - Plantar Fasciitis (Heel Spur Syndrome)  These exercises may help you when beginning to rehabilitate your injury. They may resolve your symptoms with or without further involvement from your physician, physical therapist or athletic trainer. While completing these exercises, remember:  Muscles can gain both the [place] and the strength needed for everyday activities through controlled exercises. Complete these exercises as instructed by your physician, physical therapist or athletic trainer. Progress the resistance and repetitions only as guided.  STRENGTH - Towel Curls Sit in a chair positioned on a non-carpeted surface. Place your foot on a towel, keeping your heel on the floor. Pull the towel toward your heel by only curling your toes. Keep your heel on the floor. Repeat 3 times. Complete this exercise 2 times per day.  STRENGTH - Ankle Inversion Secure one end of a rubber exercise band/tubing to a fixed object (table, pole). Loop the other end around your foot just before your toes. Place your fists between your knees. This will focus your strengthening at your ankle. Slowly, pull your big toe up and in, making sure the band/tubing is positioned to resist the entire motion. Hold this position for 10 seconds. Have your muscles resist the band/tubing as it slowly pulls your foot back to the starting position. Repeat 3 times. Complete this exercises 2 times per day.  Document Released: 11/04/2005 Document Revised: 01/27/2012 Document Reviewed:  02/16/2009 [facility] Patient Information 2014 [place], [place].

## 2023-10-07 ENCOUNTER — Encounter: Payer: Self-pay | Admitting: Cardiovascular Disease

## 2023-10-07 ENCOUNTER — Ambulatory Visit: Payer: 59 | Attending: Cardiovascular Disease | Admitting: Cardiovascular Disease

## 2023-10-07 VITALS — BP 124/78 | HR 66 | Ht 64.0 in | Wt 213.8 lb

## 2023-10-07 DIAGNOSIS — E782 Mixed hyperlipidemia: Secondary | ICD-10-CM

## 2023-10-07 DIAGNOSIS — R0789 Other chest pain: Secondary | ICD-10-CM

## 2023-10-07 DIAGNOSIS — E66812 Obesity, class 2: Secondary | ICD-10-CM

## 2023-10-07 DIAGNOSIS — K219 Gastro-esophageal reflux disease without esophagitis: Secondary | ICD-10-CM | POA: Diagnosis not present

## 2023-10-07 DIAGNOSIS — R079 Chest pain, unspecified: Secondary | ICD-10-CM

## 2023-10-07 NOTE — Progress Notes (Signed)
Cardiology Office Note    Date:  10/15/2023   ID:  Leslie Kirby, Leslie Kirby 09-29-1971, MRN [id number]  PCP:  Jackie Plum, MD  Cardiologist:  Nicki Guadalajara, MD   New cardiology consultation referred by Dr. Morrie Sheldon at [facility] for evaluation of chest pain.   History of Present Illness:  Leslie Kirby is a 52 y.o. female who is originally from [place] and has lived in [place] for over 20 years.  She sees Dr. Chrystie Nose for primary care.  She recently has been experiencing nonexertional chest pain and also notes pain in her joints predominantly in her back.  There is no exertional precipitation.  At times she notices a pressure-like sensation which can last for hours and radiates to the side and back which is aggravated by movement of her trunk.  There is no associated shortness of breath, nausea, vomiting diaphoresis, presyncope or syncope.  She denies significant shortness of breath.  She has been taking Zanaflex as needed. She is now referred by Dr. Morrie Sheldon for cardiology evaluation.   Past medical history is notable for vitamin D deficiency, mixed hyperlipidemia  She is status post C-section.  Past Surgical History:  Procedure Laterality Date   NO PAST SURGERIES      Current Medications: Outpatient Medications Prior to Visit  Medication Sig Dispense Refill   tiZANidine (ZANAFLEX) 2 MG tablet Take 1-2 tablets (2-4 mg total) by mouth every 8 (eight) hours as needed. 30 tablet 0   Cholecalciferol (SM VITAMIN D3) 125 MCG (5000 UT) TABS Take 1 tablet (5,000 Units total) by mouth daily. (Patient not taking: Reported on 10/07/2023) 30 tablet 2   Cholecalciferol (VITAMIN D3) 125 MCG (5000 UT) CAPS Take 1 capsule (5,000 Units total) by mouth daily. (Patient not taking: Reported on 10/07/2023) 30 capsule 2   diclofenac Sodium (VOLTAREN) 1 % GEL Apply topically 4 (four) times daily as directed (Patient not taking: Reported on 10/07/2023) 100 g 0   Efinaconazole  10 % SOLN Apply 1 drop topically daily. (Patient not taking: Reported on 10/07/2023) 4 mL 11   Insulin Pen Needle 32G X 4 MM MISC Use as directed with Saxenda (Patient not taking: Reported on 10/07/2023) 100 each 2   Liraglutide -Weight Management (SAXENDA) 18 MG/3ML SOPN Inject 0.6 mg into the skin daily. (Patient not taking: Reported on 10/07/2023) 12 mL 2   Liraglutide -Weight Management (SAXENDA) 18 MG/3ML SOPN Inject 1.2 mg into the skin daily. (Patient not taking: Reported on 10/07/2023) 15 mL 0   Liraglutide -Weight Management (SAXENDA) 18 MG/3ML SOPN Inject 3 mg into the skin daily. (Patient not taking: Reported on 10/07/2023) 15 mL 1   Liraglutide -Weight Management (SAXENDA) 18 MG/3ML SOPN Inject 0.6 mg into the skin daily. (Patient not taking: Reported on 10/07/2023) 12 mL 5   Liraglutide -Weight Management (SAXENDA) 18 MG/3ML SOPN Inject 3 mg into the skin daily. (Patient not taking: Reported on 10/07/2023) 15 mL 5   Liraglutide -Weight Management (SAXENDA) 18 MG/3ML SOPN Inject 3 mg into the skin daily. (Patient not taking: Reported on 10/07/2023) 15 mL 5   naproxen (NAPROSYN) 500 MG tablet Take 1 tablet (500 mg total) by mouth 2 (two) times daily as needed with meals (Patient not taking: Reported on 10/07/2023) 60 tablet 1   pantoprazole (PROTONIX) 40 MG tablet Take 1 tablet (40 mg total) by mouth 2 (two) times daily. (Patient not taking: Reported on 10/07/2023) 180 tablet 1   pantoprazole (PROTONIX) 40 MG tablet Take  1 tablet (40 mg total) by mouth 2 (two) times daily. (Patient not taking: Reported on 10/07/2023) 60 tablet 0   rosuvastatin (CRESTOR) 20 MG tablet Take 1 tablet (20 mg total) by mouth daily. (Patient not taking: Reported on 10/07/2023) 30 tablet 2   rosuvastatin (CRESTOR) 20 MG tablet Take 1 tablet (20 mg total) by mouth daily. (Patient not taking: Reported on 10/07/2023) 30 tablet 2   Semaglutide-Weight Management (WEGOVY) 0.25 MG/0.5ML SOAJ Inject 0.25 mg into the skin  once a week. (Patient not taking: Reported on 10/07/2023) 2 mL 1   sucralfate (CARAFATE) 1 g tablet Take 1 tablet (1 g total) by mouth 4 (four) times daily -  (before meals and at bedtime). (Patient not taking: Reported on 10/07/2023) 120 tablet 0   No facility-administered medications prior to visit.     Allergies:   Patient has no known allergies.   Social History   Socioeconomic History   Marital status: Married    Spouse name: Leslie Kirby   Number of children: 3   Years of education: college   Highest education level: Not on file  Occupational History    Employer: OTHER    Comment: [employer]  Tobacco Use   Smoking status: Never   Smokeless tobacco: Never  Substance and Sexual Activity   Alcohol use: No    Alcohol/week: 0.0 standard drinks of alcohol   Drug use: No   Sexual activity: Not on file  Other Topics Concern   Not on file  Social History Narrative   Patient lives at home with family.   Caffeine Use: none   Social Determinants of Corporate investment banker Strain: Not on file  Food Insecurity: Not on file  Transportation Needs: Not on file  Physical Activity: Not on file  Stress: Not on file  Social Connections: Not on file    Socially, she was born in [place] and has been [place] for over 20 years.  She is married for 28 years and has 3 children.  She lives with her son and husband.  She  works in Network engineer at [employer].  There is no tobacco history or alcohol use.  She does not routinely exercise.  Family History:  The patient's family history includes Memory loss in her mother.   ROS General: Negative; No fevers, chills, or night sweats; obesity HEENT: Negative; No changes in vision or hearing, sinus congestion, difficulty swallowing Pulmonary: Negative; No cough, wheezing, shortness of breath, hemoptysis Cardiovascular: See HPI GI: Negative; No nausea, vomiting, diarrhea, or abdominal pain GU: Negative; No dysuria, hematuria, or  difficulty voiding Musculoskeletal: Negative; no myalgias, joint pain, or weakness Hematologic/Oncology: Negative; no easy bruising, bleeding Endocrine: Negative; no heat/cold intolerance; no diabetes Neuro: Negative; no changes in balance, headaches Skin: Negative; No rashes or skin lesions Psychiatric: Negative; No behavioral problems, depression Sleep: Negative; No snoring, daytime sleepiness, hypersomnolence, bruxism, restless legs, hypnogognic hallucinations, no cataplexy Other comprehensive 14 point system review is negative.   PHYSICAL EXAM:   VS:  BP 124/78   Pulse 66   Ht 5\' 4"  (1.626 m)   Wt 213 lb 12.8 oz (97 kg)   SpO2 96%   BMI 36.70 kg/m     Repeat blood pressure by me was 122/78.  Wt Readings from Last 3 Encounters:  10/07/23 213 lb 12.8 oz (97 kg)  02/12/16 210 lb (95.3 kg)  10/21/14 216 lb 3.2 oz (98.1 kg)    General: Alert, oriented, no distress.  Skin: normal turgor, no rashes, warm and dry HEENT: Normocephalic, atraumatic. Pupils equal round and reactive to light; sclera anicteric; extraocular muscles intact;  Nose without nasal septal hypertrophy Mouth/Parynx benign; Mallinpatti scale 3 Neck: No JVD, no carotid bruits; normal carotid upstroke Lungs: clear to ausculatation and percussion; no wheezing or rales Chest wall: Tenderness to palpation.  She is wearing a wired bra which is very tight and the wire also extends inferiorly to the lateral walls.  She is tender all over where the wire is tightly against her chest.  She has significant tenderness to palpation over the costochondral region. Heart: PMI not displaced, RRR, s1 s2 normal, 1/6 systolic murmur, no diastolic murmur, no rubs, gallops, thrills, or heaves Abdomen: soft, nontender; no hepatosplenomehaly, BS+; abdominal aorta nontender and not dilated by palpation. Back: no CVA tenderness Pulses 2+ Musculoskeletal: full range of motion, normal strength, no joint deformities Extremities: no clubbing  cyanosis or edema, Homan's sign negative  Neurologic: grossly nonfocal; Cranial nerves grossly wnl Psychologic: Normal mood and affect   Studies/Labs Reviewed:   EKG Interpretation Date/Time:  Tuesday October 07 2023 14:57:58 EST Ventricular Rate:  69 PR Interval:  180 QRS Duration:  80 QT Interval:  406 QTC Calculation: 435 R Axis:   6  Text Interpretation: Normal sinus rhythm Normal ECG No previous ECGs available Confirmed by Nicki Guadalajara ([id number]) on 10/15/2023 11:07:21 AM    Recent Labs:     No data to display               No data to display              No data to display         No results found for: "MCV" No results found for: "TSH" No results found for: "HGBA1C"   BNP No results found for: "BNP"  ProBNP No results found for: "PROBNP"   Lipid Panel  No results found for: "CHOL", "TRIG", "HDL", "CHOLHDL", "VLDL", "LDLCALC", "LDLDIRECT", "LABVLDL"   RADIOLOGY: CT CARDIAC SCORING (SELF PAY ONLY)  Result Date: 10/15/2023 CLINICAL DATA:  Cardiovascular Disease Risk stratification EXAM: Coronary Calcium Score TECHNIQUE: A gated, non-contrast computed tomography scan of the heart was performed using 3mm slice thickness. Axial images were analyzed on a dedicated workstation. Calcium scoring of the coronary arteries was performed using the Agatston method. FINDINGS: Coronary arteries: Normal origins. Coronary Calcium Score: Left main: 0 Left anterior descending artery: 0 Left circumflex artery: 0 Right coronary artery: 0 Total: 0 Percentile: <1 Pericardium: Normal. Ascending Aorta: Normal caliber. Non-cardiac: See separate report from [facility]. IMPRESSION: Coronary calcium score of 0. This was <1 percentile for age-, race-, and sex-matched controls. RECOMMENDATIONS: Coronary artery calcium (CAC) score is a strong predictor of incident coronary heart disease (CHD) and provides predictive information beyond traditional risk factors. CAC scoring is  reasonable to use in the decision to withhold, postpone, or initiate statin therapy in intermediate-risk or selected borderline-risk asymptomatic adults (age 82-75 years and LDL-C >=70 to <190 mg/dL) who do not have diabetes or established atherosclerotic cardiovascular disease (ASCVD).* In intermediate-risk (10-year ASCVD risk >=7.5% to <20%) adults or selected borderline-risk (10-year ASCVD risk >=5% to <7.5%) adults in whom a CAC score is measured for the purpose of making a treatment decision the following recommendations have been made: If CAC=0, it is reasonable to withhold statin therapy and reassess in 5 to 10 years, as long as higher risk conditions are absent (diabetes mellitus, family history of premature CHD in first degree  relatives (males <55 years; females <65 years), cigarette smoking, or LDL >=190 mg/dL). If CAC is 1 to 99, it is reasonable to initiate statin therapy for patients >=30 years of age. If CAC is >=100 or >=75th percentile, it is reasonable to initiate statin therapy at any age. Cardiology referral should be considered for patients with CAC scores >=400 or >=75th percentile. *2018 AHA/ACC/AACVPR/AAPA/ABC/ACPM/ADA/AGS/APhA/ASPC/NLA/PCNA Guideline on the Management of Blood Cholesterol: A Report of the American College of Cardiology/American Heart Association Task Force on Clinical Practice Guidelines. J Am Coll Cardiol. 2019;73(24):3168-3209. Electronically Signed   By: Donato Schultz M.D.   On: 10/15/2023 10:28     Additional studies/ records that were reviewed today include:   Records of [facility], [place] were reviewed.   ASSESSMENT:    1. Musculoskeletal chest pain   2. Mixed hyperlipidemia   3. Obesity, Class II, BMI 35-39.9   4. Gastroesophageal reflux disease, unspecified whether esophagitis present     PLAN:  Ms Hepburn is a very pleasant 52 year old female from [place] is followed by Dr. Greggory Stallion Osei-Bonsu and recently has been experiencing chest discomfort which  radiates to her back.  She wears a very tight bra with significant wire support in her chest pain is exactly over where the wire is very tight.  She has significant chest wall tenderness to palpation bilaterally over the costochondral region and also has tenderness over the ribs extending to the back where the wire is present.  I suspect her chest pain is musculoskeletal in etiology and does not represent an anginal equivalent discomfort.  Her ECG is entirely unremarkable and shows normal sinus rhythm at 60 bpm.  She has a history of hyperlipidemia and has been taking rosuvastatin 20 mg daily and GERD for which she takes pantoprazole.  Of note, laboratory in June 2021 showed total cholesterol 211 LDL cholesterol 132 triglycerides 156.  Creatinine was 0.67 at that time.  I am recommending she undergo repeat fasting laboratory with a comprehensive metabolic panel, CBC, TSH, lipid panel and I will also check an LP(a).  She denies any exertional chest pain symptomatology.  I have recommended she undergo a calcium score for baseline assessment of whether or not coronary calcification is present and if so importance of aggressive lipid-lowering therapy to reduce potential for progression, induce plaque stability and hopeful regression.  Also, she states that she does not sleep too well.  Her sleep is poor and that she does have fatigability.  She will continue to monitor this.  I did suggest possible future sleep evaluation with a sleep study.  With her chest wall discomfort I have suggested nonsteroidal anti-inflammatory medication over-the-counter or topical Voltaren to apply to her area of soreness.  I will contact her regarding her results and plan to see her in 4 months for follow-up evaluation.   Medication Adjustments/Labs and Tests Ordered: Current medicines are reviewed at length with the patient today.  Concerns regarding medicines are outlined above.  Medication changes, Labs and Tests ordered today are  listed in the Patient Instructions below. Patient Instructions  Medication Instructions:  Take over the counter pain reliever for the chest wall pain.  *If you need a refill on your cardiac medications before your next appointment, please call your pharmacy*   Lab Work: Return for fasting labs. CMET, CBC, TSH, LIPID, LPa If you have labs (blood work) drawn today and your tests are completely normal, you will receive your results only by: MyChart Message (if you have MyChart) OR A paper  copy in the mail If you have any lab test that is abnormal or we need to change your treatment, we will call you to review the results.   Testing/Procedures: CT coronary calcium score.   Test locations:  [facility]  [facility]  This is $99 out of pocket.   Coronary CalciumScan A coronary calcium scan is an imaging test used to look for deposits of calcium and other fatty materials (plaques) in the inner lining of the blood vessels of the heart (coronary arteries). These deposits of calcium and plaques can partly clog and narrow the coronary arteries without producing any symptoms or warning signs. This puts a person at risk for a heart attack. This test can detect these deposits before symptoms develop. Tell a health care provider about: Any allergies you have. All medicines you are taking, including vitamins, herbs, eye drops, creams, and over-the-counter medicines. Any problems you or family members have had with anesthetic medicines. Any blood disorders you have. Any surgeries you have had. Any medical conditions you have. Whether you are pregnant or may be pregnant. What are the risks? Generally, this is a safe procedure. However, problems may occur, including: Harm to a pregnant woman and her unborn baby. This test involves the use of radiation. Radiation exposure can be dangerous to a pregnant  woman and her unborn baby. If you are pregnant, you generally should not have this procedure done. Slight increase in the risk of cancer. This is because of the radiation involved in the test. What happens before the procedure? No preparation is needed for this procedure. What happens during the procedure? You will undress and remove any jewelry around your neck or chest. You will put on a hospital gown. Sticky electrodes will be placed on your chest. The electrodes will be connected to an electrocardiogram (ECG) machine to record a tracing of the electrical activity of your heart. A CT scanner will take pictures of your heart. During this time, you will be asked to lie still and hold your breath for 2-3 seconds while a picture of your heart is being taken. The procedure may vary among health care providers and hospitals. What happens after the procedure? You can get dressed. You can return to your normal activities. It is up to you to get the results of your test. Ask your health care provider, or the department that is doing the test, when your results will be ready. Summary A coronary calcium scan is an imaging test used to look for deposits of calcium and other fatty materials (plaques) in the inner lining of the blood vessels of the heart (coronary arteries). Generally, this is a safe procedure. Tell your health care provider if you are pregnant or may be pregnant. No preparation is needed for this procedure. A CT scanner will take pictures of your heart. You can return to your normal activities after the scan is done. This information is not intended to replace advice given to you by your health care provider. Make sure you discuss any questions you have with your health care provider. Document Released: 05/02/2008 Document Revised: 09/23/2016 Document Reviewed: 09/23/2016 Elsevier Interactive Patient Education  2017 [employer].    Follow-Up: At [facility], you and your  health needs are our priority.  As part of our continuing mission to provide you with exceptional heart care, we have created designated Provider Care Teams.  These Care Teams include  your primary Cardiologist (physician) and Advanced Practice Providers (APPs -  Physician Assistants and Nurse Practitioners) who all work together to provide you with the care you need, when you need it.  We recommend signing up for the patient portal called "MyChart".  Sign up information is provided on this After Visit Summary.  MyChart is used to connect with patients for Virtual Visits (Telemedicine).  Patients are able to view lab/test results, encounter notes, upcoming appointments, etc.  Non-urgent messages can be sent to your provider as well.   To learn more about what you can do with MyChart, go to [email or web address].    Your next appointment:   3-4 month(s)  Provider:   Dr. Nicki Guadalajara        Signed, Nicki Guadalajara, MD  10/15/2023 11:21 AM    [facility] [address], [address], [place], [place]  [address] Phone: [phone]

## 2023-10-07 NOTE — Patient Instructions (Signed)
Medication Instructions:  Take over the counter pain reliever for the chest wall pain.  *If you need a refill on your cardiac medications before your next appointment, please call your pharmacy*   Lab Work: Return for fasting labs. CMET, CBC, TSH, LIPID, LPa If you have labs (blood work) drawn today and your tests are completely normal, you will receive your results only by: MyChart Message (if you have MyChart) OR A paper copy in the mail If you have any lab test that is abnormal or we need to change your treatment, we will call you to review the results.   Testing/Procedures: CT coronary calcium score.   Test locations:  [facility]  [facility]  This is $99 out of pocket.   Coronary CalciumScan A coronary calcium scan is an imaging test used to look for deposits of calcium and other fatty materials (plaques) in the inner lining of the blood vessels of the heart (coronary arteries). These deposits of calcium and plaques can partly clog and narrow the coronary arteries without producing any symptoms or warning signs. This puts a person at risk for a heart attack. This test can detect these deposits before symptoms develop. Tell a health care provider about: Any allergies you have. All medicines you are taking, including vitamins, herbs, eye drops, creams, and over-the-counter medicines. Any problems you or family members have had with anesthetic medicines. Any blood disorders you have. Any surgeries you have had. Any medical conditions you have. Whether you are pregnant or may be pregnant. What are the risks? Generally, this is a safe procedure. However, problems may occur, including: Harm to a pregnant woman and her unborn baby. This test involves the use of radiation. Radiation exposure can be dangerous to a pregnant woman and her unborn baby. If you are pregnant, you generally should  not have this procedure done. Slight increase in the risk of cancer. This is because of the radiation involved in the test. What happens before the procedure? No preparation is needed for this procedure. What happens during the procedure? You will undress and remove any jewelry around your neck or chest. You will put on a hospital gown. Sticky electrodes will be placed on your chest. The electrodes will be connected to an electrocardiogram (ECG) machine to record a tracing of the electrical activity of your heart. A CT scanner will take pictures of your heart. During this time, you will be asked to lie still and hold your breath for 2-3 seconds while a picture of your heart is being taken. The procedure may vary among health care providers and hospitals. What happens after the procedure? You can get dressed. You can return to your normal activities. It is up to you to get the results of your test. Ask your health care provider, or the department that is doing the test, when your results will be ready. Summary A coronary calcium scan is an imaging test used to look for deposits of calcium and other fatty materials (plaques) in the inner lining of the blood vessels of the heart (coronary arteries). Generally, this is a safe procedure. Tell your health care provider if you are pregnant or may be pregnant. No preparation is needed for this procedure. A CT scanner will take pictures of your heart. You can return to your normal activities after the scan is done. This information is not intended to replace advice given to you by your health  care provider. Make sure you discuss any questions you have with your health care provider. Document Released: 05/02/2008 Document Revised: 09/23/2016 Document Reviewed: 09/23/2016 Elsevier Interactive Patient Education  2017 [employer].    Follow-Up: At [facility], you and your health needs are our priority.  As part of our continuing mission to  provide you with exceptional heart care, we have created designated Provider Care Teams.  These Care Teams include your primary Cardiologist (physician) and Advanced Practice Providers (APPs -  Physician Assistants and Nurse Practitioners) who all work together to provide you with the care you need, when you need it.  We recommend signing up for the patient portal called "MyChart".  Sign up information is provided on this After Visit Summary.  MyChart is used to connect with patients for Virtual Visits (Telemedicine).  Patients are able to view lab/test results, encounter notes, upcoming appointments, etc.  Non-urgent messages can be sent to your provider as well.   To learn more about what you can do with MyChart, go to [email or web address].    Your next appointment:   3-4 month(s)  Provider:   Dr. Nicki Guadalajara

## 2023-10-15 ENCOUNTER — Encounter: Payer: Self-pay | Admitting: Cardiovascular Disease

## 2023-10-15 ENCOUNTER — Ambulatory Visit (HOSPITAL_COMMUNITY)
Admission: RE | Admit: 2023-10-15 | Discharge: 2023-10-15 | Disposition: A | Discharge status: Home / Self Care | Payer: 59 | Source: Ambulatory Visit | Attending: Cardiovascular Disease | Admitting: Cardiovascular Disease

## 2023-10-15 DIAGNOSIS — R079 Chest pain, unspecified: Secondary | ICD-10-CM

## 2023-10-18 LAB — TSH: TSH: 1.79 u[IU]/mL (ref 0.450–4.500)

## 2023-10-18 LAB — LIPID PANEL
Chol/HDL Ratio: 3.9 {ratio} (ref 0.0–4.4)
Cholesterol, Total: 229 mg/dL — ABNORMAL HIGH (ref 100–199)
HDL: 59 mg/dL (ref >39)
LDL Chol Calc (NIH): 154 mg/dL — ABNORMAL HIGH (ref 0–99)
Triglycerides: 89 mg/dL (ref 0–149)
VLDL Cholesterol Cal: 16 mg/dL (ref 5–40)

## 2023-10-18 LAB — CBC
Hematocrit: 41.6 % (ref 34.0–46.6)
Hemoglobin: 13 g/dL (ref 11.1–15.9)
MCH: 29 pg (ref 26.6–33.0)
MCHC: 31.3 g/dL — ABNORMAL LOW (ref 31.5–35.7)
MCV: 93 fL (ref 79–97)
Platelets: 354 10*3/uL (ref 150–450)
RBC: 4.49 x10E6/uL (ref 3.77–5.28)
RDW: 12.2 % (ref 11.7–15.4)
WBC: 8 10*3/uL (ref 3.4–10.8)

## 2023-10-18 LAB — COMPREHENSIVE METABOLIC PANEL
ALT: 14 [IU]/L (ref 0–32)
AST: 18 [IU]/L (ref 0–40)
Albumin: 4.3 g/dL (ref 3.8–4.9)
Alkaline Phosphatase: 122 [IU]/L — ABNORMAL HIGH (ref 44–121)
BUN/Creatinine Ratio: 19 (ref 9–23)
BUN: 15 mg/dL (ref 6–24)
Bilirubin Total: 0.3 mg/dL (ref 0.0–1.2)
CO2: 24 mmol/L (ref 20–29)
Calcium: 9.9 mg/dL (ref 8.7–10.2)
Chloride: 104 mmol/L (ref 96–106)
Creatinine, Ser: 0.8 mg/dL (ref 0.57–1.00)
Globulin, Total: 3 g/dL (ref 1.5–4.5)
Glucose: 87 mg/dL (ref 70–99)
Potassium: 4.2 mmol/L (ref 3.5–5.2)
Sodium: 142 mmol/L (ref 134–144)
Total Protein: 7.3 g/dL (ref 6.0–8.5)
eGFR: 88 mL/min/{1.73_m2} (ref >59)

## 2023-10-18 LAB — LIPOPROTEIN A (LPA): Lipoprotein (a): 74.6 nmol/L (ref <75.0)

## 2023-10-21 ENCOUNTER — Ambulatory Visit
Admission: RE | Admit: 2023-10-21 | Discharge: 2023-10-21 | Disposition: A | Discharge status: Home / Self Care | Payer: 59 | Source: Ambulatory Visit | Attending: Physical Medicine and Rehabilitation | Admitting: Physical Medicine and Rehabilitation

## 2023-10-21 ENCOUNTER — Other Ambulatory Visit: Payer: Self-pay | Admitting: Physical Medicine and Rehabilitation

## 2023-10-21 DIAGNOSIS — R079 Chest pain, unspecified: Secondary | ICD-10-CM

## 2023-10-21 DIAGNOSIS — M25511 Pain in right shoulder: Secondary | ICD-10-CM

## 2023-10-21 DIAGNOSIS — M7551 Bursitis of right shoulder: Secondary | ICD-10-CM | POA: Diagnosis not present

## 2023-10-21 DIAGNOSIS — G894 Chronic pain syndrome: Secondary | ICD-10-CM | POA: Diagnosis not present

## 2023-10-21 DIAGNOSIS — Z79891 Long term (current) use of opiate analgesic: Secondary | ICD-10-CM | POA: Diagnosis not present

## 2023-10-21 DIAGNOSIS — M546 Pain in thoracic spine: Secondary | ICD-10-CM | POA: Diagnosis not present

## 2023-10-21 DIAGNOSIS — M791 Myalgia, unspecified site: Secondary | ICD-10-CM | POA: Diagnosis not present

## 2023-10-28 ENCOUNTER — Telehealth: Payer: Self-pay

## 2023-10-28 ENCOUNTER — Other Ambulatory Visit (HOSPITAL_COMMUNITY): Payer: Self-pay

## 2023-10-28 DIAGNOSIS — E782 Mixed hyperlipidemia: Secondary | ICD-10-CM

## 2023-10-28 MED ORDER — ROSUVASTATIN CALCIUM 40 MG PO TABS
40.0000 mg | ORAL_TABLET | Freq: Every day | ORAL | 3 refills | Status: DC
  Filled 2023-10-28: qty 90, 90d supply, fill #0
  Filled 2024-02-10: qty 90, 90d supply, fill #1
  Filled 2024-06-07: qty 90, 90d supply, fill #2

## 2023-10-28 NOTE — Telephone Encounter (Signed)
Based on Dr. Tresa Endo suggestion, pt has agreed to increase the Atorvastatin 20 to 40mg . Medication was sent to pharmacy.

## 2023-10-29 ENCOUNTER — Telehealth: Payer: Self-pay | Admitting: Cardiovascular Disease

## 2023-10-29 NOTE — Telephone Encounter (Signed)
Note printed for doctor review

## 2023-10-29 NOTE — Telephone Encounter (Signed)
Spoke to Erskine Squibb , radiology representative   She wanted Dr Tresa Endo to be aware of impression from reading radiologist   7 mm focus of suspected scarring and/or atelectasis within the right lower lobe. A small pleural based lung nodule cannot completely be excluded. Non-contrast chest CT at 6-12 months is recommended. If the nodule is stable at time of repeat CT, then future CT at 18-24 months (from today's scan) is considered optional for low-risk patients, but is recommended for high-risk patients. This recommendation follows the consensus statement: Guidelines for Management of Incidental Pulmonary Nodules Detected on CT Images: From the Fleischner Society 2017; Radiology 2017; 284:228-243.   Forward to Dr Tresa Endo to review.

## 2023-10-29 NOTE — Telephone Encounter (Signed)
Calling with a call report. Please advise  

## 2023-10-29 NOTE — Telephone Encounter (Signed)
Please see below.

## 2023-10-30 ENCOUNTER — Other Ambulatory Visit (HOSPITAL_COMMUNITY): Payer: Self-pay

## 2023-11-17 NOTE — Telephone Encounter (Signed)
Agree with recommendation

## 2023-12-19 DIAGNOSIS — Z6835 Body mass index (BMI) 35.0-35.9, adult: Secondary | ICD-10-CM | POA: Diagnosis not present

## 2023-12-19 DIAGNOSIS — E782 Mixed hyperlipidemia: Secondary | ICD-10-CM | POA: Diagnosis not present

## 2023-12-19 DIAGNOSIS — R1013 Epigastric pain: Secondary | ICD-10-CM | POA: Diagnosis not present

## 2023-12-19 DIAGNOSIS — E559 Vitamin D deficiency, unspecified: Secondary | ICD-10-CM | POA: Diagnosis not present

## 2023-12-19 DIAGNOSIS — E6609 Other obesity due to excess calories: Secondary | ICD-10-CM | POA: Diagnosis not present

## 2024-01-27 ENCOUNTER — Other Ambulatory Visit (HOSPITAL_COMMUNITY): Payer: Self-pay

## 2024-02-09 ENCOUNTER — Ambulatory Visit: Payer: 59 | Admitting: Cardiovascular Disease

## 2024-03-26 ENCOUNTER — Ambulatory Visit: Attending: Nurse Practitioner | Admitting: Nurse Practitioner

## 2024-03-26 ENCOUNTER — Encounter: Payer: Self-pay | Admitting: Nurse Practitioner

## 2024-03-26 VITALS — BP 136/90 | HR 66 | Ht 64.0 in | Wt 211.0 lb

## 2024-03-26 DIAGNOSIS — E66812 Obesity, class 2: Secondary | ICD-10-CM

## 2024-03-26 DIAGNOSIS — E782 Mixed hyperlipidemia: Secondary | ICD-10-CM

## 2024-03-26 DIAGNOSIS — R030 Elevated blood-pressure reading, without diagnosis of hypertension: Secondary | ICD-10-CM | POA: Diagnosis not present

## 2024-03-26 DIAGNOSIS — R0789 Other chest pain: Secondary | ICD-10-CM

## 2024-03-26 DIAGNOSIS — R072 Precordial pain: Secondary | ICD-10-CM

## 2024-03-26 NOTE — Patient Instructions (Signed)
 Medication Instructions:  Your physician recommends that you continue on your current medications as directed. Please refer to the Current Medication list given to you today.  *If you need a refill on your cardiac medications before your next appointment, please call your pharmacy*  Lab Work: Fasting Lipid panel, CMET, ESR, CRP today  Testing/Procedures: NONE ordered at this time of appointment   Follow-Up: At [facility], you and your health needs are our priority.  As part of our continuing mission to provide you with exceptional heart care, our providers are all part of one team.  This team includes your primary Cardiologist (physician) and Advanced Practice Providers or APPs (Physician Assistants and Nurse Practitioners) who all work together to provide you with the care you need, when you need it.  Your next appointment:   4-6 month(s)  Provider:   Dr. Emmette Harms    We recommend signing up for the patient portal called "MyChart".  Sign up information is provided on this After Visit Summary.  MyChart is used to connect with patients for Virtual Visits (Telemedicine).  Patients are able to view lab/test results, encounter notes, upcoming appointments, etc.  Non-urgent messages can be sent to your provider as well.   To learn more about what you can do with MyChart, go to [email or web address].

## 2024-03-26 NOTE — Progress Notes (Unsigned)
 Office Visit    Patient Name: Leslie Kirby Date of Encounter: 03/26/2024  Primary Care Provider:  Tretha Fu, MD Primary Cardiologist:  Magnus Schuller, MD  Chief Complaint    53 year old female with a history of atypical chest pain, hyperlipidemia, obesity, and GERD who presents for follow-up related to chest pain.  Past Medical History    No past medical history on file. Past Surgical History:  Procedure Laterality Date   NO PAST SURGERIES      Allergies  No Known Allergies   Labs/Other Studies Reviewed    The following studies were reviewed today:  Cardiac Studies & Procedures   ______________________________________________________________________________________________          CT SCANS  CT CARDIAC SCORING (SELF PAY ONLY) 10/15/2023  Addendum 10/28/2023 11:49 PM ADDENDUM REPORT: 10/28/2023 23:47  EXAM: OVER-READ INTERPRETATION  CT CHEST  The following report is an over-read performed by radiologist Dr. Virgle Grime of [facility] on 10/28/2023. This over-read does not include interpretation of cardiac or coronary anatomy or pathology. The coronary calcium  score/coronary CTA interpretation by the cardiologist is attached.  COMPARISON:  May 09, 2020  FINDINGS: Cardiovascular: There are no significant extracardiac vascular findings.  Mediastinum/Nodes: There are no enlarged lymph nodes within the visualized mediastinum.  Lungs/Pleura: There is no pleural effusion. An ill-defined 7 mm focus of scarring and/or atelectasis is suspected within the posteromedial aspect of the right lower lobe (axial CT image 30, CT series 5).  Upper abdomen: No significant findings in the visualized upper abdomen.  Musculoskeletal/Chest wall: No chest wall mass or suspicious osseous findings within the visualized chest.  IMPRESSION: 7 mm focus of suspected scarring and/or atelectasis within the right lower lobe. A small pleural  based lung nodule cannot completely be excluded. Non-contrast chest CT at 6-12 months is recommended. If the nodule is stable at time of repeat CT, then future CT at 18-24 months (from today's scan) is considered optional for low-risk patients, but is recommended for high-risk patients. This recommendation follows the consensus statement: Guidelines for Management of Incidental Pulmonary Nodules Detected on CT Images: From the Fleischner Society 2017; Radiology 2017; 284:228-243.   Electronically Signed By: Virgle Grime M.D. On: 10/28/2023 23:47  Narrative CLINICAL DATA:  Cardiovascular Disease Risk stratification  EXAM: Coronary Calcium  Score  TECHNIQUE: A gated, non-contrast computed tomography scan of the heart was performed using 3mm slice thickness. Axial images were analyzed on a dedicated workstation. Calcium  scoring of the coronary arteries was performed using the Agatston method.  FINDINGS: Coronary arteries: Normal origins.  Coronary Calcium  Score:  Left main: 0  Left anterior descending artery: 0  Left circumflex artery: 0  Right coronary artery: 0  Total: 0  Percentile: <1  Pericardium: Normal.  Ascending Aorta: Normal caliber.  Non-cardiac: See separate report from [facility].  IMPRESSION: Coronary calcium  score of 0. This was <1 percentile for age-, race-, and sex-matched controls.  RECOMMENDATIONS: Coronary artery calcium  (CAC) score is a strong predictor of incident coronary heart disease (CHD) and provides predictive information beyond traditional risk factors. CAC scoring is reasonable to use in the decision to withhold, postpone, or initiate statin therapy in intermediate-risk or selected borderline-risk asymptomatic adults (age 65-75 years and LDL-C >=70 to <190 mg/dL) who do not have diabetes or established atherosclerotic cardiovascular disease (ASCVD).* In intermediate-risk (10-year ASCVD risk >=7.5% to <20%) adults  or selected borderline-risk (10-year ASCVD risk >=5% to <7.5%) adults in whom a CAC score is measured for the purpose of  making a treatment decision the following recommendations have been made:  If CAC=0, it is reasonable to withhold statin therapy and reassess in 5 to 10 years, as long as higher risk conditions are absent (diabetes mellitus, family history of premature CHD in first degree relatives (males <55 years; females <65 years), cigarette smoking, or LDL >=190 mg/dL).  If CAC is 1 to 99, it is reasonable to initiate statin therapy for patients >=21 years of age.  If CAC is >=100 or >=75th percentile, it is reasonable to initiate statin therapy at any age.  Cardiology referral should be considered for patients with CAC scores >=400 or >=75th percentile.  *2018 AHA/ACC/AACVPR/AAPA/ABC/ACPM/ADA/AGS/APhA/ASPC/NLA/PCNA Guideline on the Management of Blood Cholesterol: A Report of the American College of Cardiology/American Heart Association Task Force on Clinical Practice Guidelines. J Am Coll Cardiol. 2019;73(24):3168-3209.  Electronically Signed: By: Dorothye Gathers M.D. On: 10/15/2023 10:28     ______________________________________________________________________________________________     Recent Labs: 10/17/2023: ALT 14; BUN 15; Creatinine, Ser 0.80; Hemoglobin 13.0; Platelets 354; Potassium 4.2; Sodium 142; TSH 1.790  Recent Lipid Panel    Component Value Date/Time   CHOL 229 (H) 10/17/2023 1014   TRIG 89 10/17/2023 1014   HDL 59 10/17/2023 1014   CHOLHDL 3.9 10/17/2023 1014   LDLCALC 154 (H) 10/17/2023 1014    History of Present Illness    53 year old female with the above past medical history including atypical chest pain, hyperlipidemia, obesity, and GERD.  She was referred to cardiology in the setting of nonexertional chest pain.  She was initially evaluated by Dr. Loetta Ringer and was last in the office on 10/07/2023.  She denied exertional symptoms concerning  for angina.  She did report wearing a very tight bra with significant wire support in her chest over the site of her chest discomfort.  It was felt that her chest discomfort was likely musculoskeletal.  EKG was unremarkable. Coronary calcium  score in 09/2023 was 0.  Treatment with NSAIDs and Voltaren  gel was recommended.  She also noted poor sleep quality, fatigability.  Possible sleep study was recommended, but deferred.  She presents today for follow-up.  Since her last visit  She has been stable from a cardiac standpoint.  She continues to note discomfort in the center of her chest that radiates around to her back under her rib cage, it is worse with palpation, and deep breaths.  She denies any exertional symptoms concerning for angina.  We discussed possible echocardiogram, she would prefer to defer for now.  Symptoms sound musculoskeletal in nature, recent coronary calcium  score reassuring.  She was started on Crestor .  Will check fasting lipids, CMET, will also check ESR, CRP given ongoing symptoms.  Advised her to take ibuprofen, try Voltaren  gel.  I suspect her symptoms are noncardiac in etiology.  Continue to monitor BP as it is elevated in office today, report BP consistent greater than 130/80.  Follow-up in 4 to 6 months with Dr. Emmette Harms.   Atypical chest pain: Hyperlipidemia: Obesity: Sleep disturbance/fatigability: Disposition:   Home Medications    Current Outpatient Medications  Medication Sig Dispense Refill   rosuvastatin  (CRESTOR ) 40 MG tablet Take 1 tablet (40 mg total) by mouth daily. 90 tablet 3   tiZANidine  (ZANAFLEX ) 2 MG tablet Take 1-2 tablets (2-4 mg total) by mouth every 8 (eight) hours as needed. 30 tablet 0   No current facility-administered medications for this visit.     Review of Systems    ***.  All other systems reviewed and  are otherwise negative except as noted above.    Physical Exam    VS:  BP (!) 136/90 (BP Location: Left Arm, Patient Position:  Sitting)   Pulse 66   Ht 5\' 4"  (1.626 m)   Wt 211 lb (95.7 kg)   SpO2 97%   BMI 36.22 kg/m  GEN: Well nourished, well developed, in no acute distress. HEENT: normal. Neck: Supple, no JVD, carotid bruits, or masses. Cardiac: RRR, no murmurs, rubs, or gallops. No clubbing, cyanosis, edema.  Radials/DP/PT 2+ and equal bilaterally.  Respiratory:  Respirations regular and unlabored, clear to auscultation bilaterally. GI: Soft, nontender, nondistended, BS + x 4. MS: no deformity or atrophy. Skin: warm and dry, no rash. Neuro:  Strength and sensation are intact. Psych: Normal affect.  Accessory Clinical Findings    ECG personally reviewed by me today -    - no EKG in office today. Lab Results  Component Value Date   WBC 8.0 10/17/2023   HGB 13.0 10/17/2023   HCT 41.6 10/17/2023   MCV 93 10/17/2023   PLT 354 10/17/2023   Lab Results  Component Value Date   CREATININE 0.80 10/17/2023   BUN 15 10/17/2023   NA 142 10/17/2023   K 4.2 10/17/2023   CL 104 10/17/2023   CO2 24 10/17/2023   Lab Results  Component Value Date   ALT 14 10/17/2023   AST 18 10/17/2023   ALKPHOS 122 (H) 10/17/2023   BILITOT 0.3 10/17/2023   Lab Results  Component Value Date   CHOL 229 (H) 10/17/2023   HDL 59 10/17/2023   LDLCALC 154 (H) 10/17/2023   TRIG 89 10/17/2023   CHOLHDL 3.9 10/17/2023    No results found for: "HGBA1C"  Assessment & Plan    1.  ***  The patient's 1st BP is elevated (>139/89)*** Repeat BP and {Click to enter a 2nd BP Refresh Note  :1}   Jude Norton, NP 03/26/2024, 10:02 AM

## 2024-03-27 LAB — SEDIMENTATION RATE: Sed Rate: 2 mm/h (ref 0–40)

## 2024-03-27 LAB — LIPID PANEL
Chol/HDL Ratio: 2.8 ratio (ref 0.0–4.4)
Cholesterol, Total: 108 mg/dL (ref 100–199)
HDL: 39 mg/dL — ABNORMAL LOW (ref >39)
LDL Chol Calc (NIH): 50 mg/dL (ref 0–99)
Triglycerides: 103 mg/dL (ref 0–149)
VLDL Cholesterol Cal: 19 mg/dL (ref 5–40)

## 2024-03-27 LAB — COMPREHENSIVE METABOLIC PANEL WITH GFR
ALT: 20 IU/L (ref 0–32)
AST: 22 IU/L (ref 0–40)
Albumin: 4.3 g/dL (ref 3.8–4.9)
Alkaline Phosphatase: 111 IU/L (ref 44–121)
BUN/Creatinine Ratio: 16 (ref 9–23)
BUN: 11 mg/dL (ref 6–24)
Bilirubin Total: 0.3 mg/dL (ref 0.0–1.2)
CO2: 21 mmol/L (ref 20–29)
Calcium: 9.5 mg/dL (ref 8.7–10.2)
Chloride: 105 mmol/L (ref 96–106)
Creatinine, Ser: 0.68 mg/dL (ref 0.57–1.00)
Globulin, Total: 2.7 g/dL (ref 1.5–4.5)
Glucose: 90 mg/dL (ref 70–99)
Potassium: 4.4 mmol/L (ref 3.5–5.2)
Sodium: 140 mmol/L (ref 134–144)
Total Protein: 7 g/dL (ref 6.0–8.5)
eGFR: 103 mL/min/{1.73_m2} (ref >59)

## 2024-03-27 LAB — C-REACTIVE PROTEIN: CRP: 6 mg/L (ref 0–10)

## 2024-03-28 ENCOUNTER — Encounter: Payer: Self-pay | Admitting: Nurse Practitioner

## 2024-03-30 ENCOUNTER — Ambulatory Visit: Payer: Self-pay

## 2024-05-12 ENCOUNTER — Other Ambulatory Visit (HOSPITAL_COMMUNITY): Payer: Self-pay

## 2024-05-12 DIAGNOSIS — R1013 Epigastric pain: Secondary | ICD-10-CM | POA: Diagnosis not present

## 2024-05-12 DIAGNOSIS — E782 Mixed hyperlipidemia: Secondary | ICD-10-CM | POA: Diagnosis not present

## 2024-05-12 DIAGNOSIS — E559 Vitamin D deficiency, unspecified: Secondary | ICD-10-CM | POA: Diagnosis not present

## 2024-05-12 MED ORDER — VITAMIN D3 125 MCG (5000 UT) PO CAPS
1.0000 | ORAL_CAPSULE | Freq: Every day | ORAL | 2 refills | Status: AC
  Filled 2024-05-12: qty 30, 30d supply, fill #0
  Filled 2024-06-07: qty 30, 30d supply, fill #1
  Filled 2024-07-09 (×2): qty 30, 30d supply, fill #2

## 2024-05-12 MED ORDER — PANTOPRAZOLE SODIUM 40 MG PO TBEC
40.0000 mg | DELAYED_RELEASE_TABLET | Freq: Two times a day (BID) | ORAL | 0 refills | Status: DC
  Filled 2024-05-12: qty 60, 30d supply, fill #0

## 2024-05-12 MED ORDER — ROSUVASTATIN CALCIUM 20 MG PO TABS
20.0000 mg | ORAL_TABLET | Freq: Every day | ORAL | 2 refills | Status: AC
  Filled 2024-05-12: qty 30, 30d supply, fill #0
  Filled 2024-06-07: qty 30, 30d supply, fill #1
  Filled 2024-07-09 (×2): qty 30, 30d supply, fill #2

## 2024-05-13 ENCOUNTER — Other Ambulatory Visit (HOSPITAL_COMMUNITY): Payer: Self-pay

## 2024-06-07 ENCOUNTER — Other Ambulatory Visit (HOSPITAL_COMMUNITY): Payer: Self-pay

## 2024-06-17 ENCOUNTER — Encounter (INDEPENDENT_AMBULATORY_CARE_PROVIDER_SITE_OTHER): Payer: Self-pay | Admitting: Adult Health

## 2024-06-17 ENCOUNTER — Ambulatory Visit (INDEPENDENT_AMBULATORY_CARE_PROVIDER_SITE_OTHER): Admitting: Adult Health

## 2024-06-17 VITALS — BP 141/92 | HR 87 | Temp 98.3°F | Ht 65.0 in | Wt 209.0 lb

## 2024-06-17 DIAGNOSIS — K219 Gastro-esophageal reflux disease without esophagitis: Secondary | ICD-10-CM | POA: Diagnosis not present

## 2024-06-17 DIAGNOSIS — E559 Vitamin D deficiency, unspecified: Secondary | ICD-10-CM | POA: Diagnosis not present

## 2024-06-17 DIAGNOSIS — E669 Obesity, unspecified: Secondary | ICD-10-CM | POA: Diagnosis not present

## 2024-06-17 DIAGNOSIS — Z6834 Body mass index (BMI) 34.0-34.9, adult: Secondary | ICD-10-CM

## 2024-06-17 DIAGNOSIS — Z0289 Encounter for other administrative examinations: Secondary | ICD-10-CM

## 2024-06-17 DIAGNOSIS — E785 Hyperlipidemia, unspecified: Secondary | ICD-10-CM

## 2024-06-17 NOTE — Progress Notes (Signed)
 Office: [phone]  /  Fax: [phone]   Initial Visit    Robie A Westergaard was seen in clinic today to evaluate for obesity. She is interested in losing weight to improve overall health and reduce the risk of weight related complications. She presents today to review program treatment options, initial physical assessment, and evaluation.     She was referred by: Self-Referral  When asked what else they would like to accomplish? She states: Adopt a healthier eating pattern and lifestyle, Improve energy levels and physical activity, Improve existing medical conditions, Improve quality of life, and Current Weight 209 lbs, Goal Weight 180 lbs  When asked how has your weight affected you? She states: Contributed to orthopedic problems or mobility issues, Having fatigue, Having poor [place], and Problems with eating patterns  Weight history: Weight gain since mid 40s/post menapause  Highest weight: 210 lbs  Some associated conditions: Hypertension, Hyperlipidemia, and Vitamin D Deficiency  Contributing factors: consumption of processed foods, moderate to high levels of stress, and post menapause  Weight promoting medications identified: None  Prior weight loss attempts: Balanced Plate / Portion Control  Current nutrition plan: Portion control / smart choices  Current level of physical activity: Walking 120 minutes, once a week  Current or previous pharmacotherapy: GLP-1  Response to medication: Was cost prohibitive or lost coverage for AOM   Past medical history includes:  History reviewed. No pertinent past medical history.   Objective    BP (!) 141/92   Pulse 87   Temp 98.3 F (36.8 C)   Ht 5' 5 (1.651 m)   Wt 209 lb (94.8 kg)   SpO2 99%   BMI 34.78 kg/m  She was weighed on the bioimpedance scale: Body mass index is 34.78 kg/m.  Body Fat%:45.4, Visceral Fat Rating:12, Weight trend over the last 12 months: Decreasing  General:  Alert, oriented and  cooperative. Patient is in no acute distress.  Respiratory: Normal respiratory effort, no problems with respiration noted   Gait: able to ambulate independently  Mental Status: Normal mood and affect. Normal behavior. Normal judgment and thought content.   DIAGNOSTIC DATA REVIEWED:  BMET    Component Value Date/Time   NA 140 03/26/2024 1042   K 4.4 03/26/2024 1042   CL 105 03/26/2024 1042   CO2 21 03/26/2024 1042   GLUCOSE 90 03/26/2024 1042   BUN 11 03/26/2024 1042   CREATININE 0.68 03/26/2024 1042   CALCIUM  9.5 03/26/2024 1042   No results found for: HGBA1C No results found for: INSULIN  CBC    Component Value Date/Time   WBC 8.0 10/17/2023 1014   RBC 4.49 10/17/2023 1014   HGB 13.0 10/17/2023 1014   HCT 41.6 10/17/2023 1014   PLT 354 10/17/2023 1014   MCV 93 10/17/2023 1014   MCH 29.0 10/17/2023 1014   MCHC 31.3 (L) 10/17/2023 1014   RDW 12.2 10/17/2023 1014   Iron/TIBC/Ferritin/ %Sat No results found for: IRON, TIBC, FERRITIN, IRONPCTSAT Lipid Panel     Component Value Date/Time   CHOL 108 03/26/2024 1042   TRIG 103 03/26/2024 1042   HDL 39 (L) 03/26/2024 1042   CHOLHDL 2.8 03/26/2024 1042   LDLCALC 50 03/26/2024 1042   Hepatic Function Panel     Component Value Date/Time   PROT 7.0 03/26/2024 1042   ALBUMIN 4.3 03/26/2024 1042   AST 22 03/26/2024 1042   ALT 20 03/26/2024 1042   ALKPHOS 111 03/26/2024 1042   BILITOT 0.3 03/26/2024 1042  Component Value Date/Time   TSH 1.790 10/17/2023 1014     Assessment and Plan   Gastroesophageal reflux disease, unspecified whether esophagitis present  Hyperlipidemia, unspecified hyperlipidemia type  Vitamin D deficiency  Obesity (BMI 30-39.9), STARTING BMI 35.9    Assessment and Plan          ESTABLISH WITH HWW    Obesity Treatment / Action Plan:  Patient will work on garnering support from family and friends to begin weight loss journey. Will work on eliminating or reducing the  presence of highly palatable, calorie dense foods in the home. Will complete provided nutritional and psychosocial assessment questionnaire before the next appointment. Will be scheduled for indirect calorimetry to determine resting energy expenditure in a fasting state.  This will allow us  to create a reduced calorie, high-protein meal plan to promote loss of fat mass while preserving muscle mass. Counseled on the health benefits of losing 5%-15% of total body weight. Was counseled on nutritional approaches to weight loss and benefits of reducing processed foods and consuming plant-based foods and high quality protein as part of nutritional weight management. Was counseled on pharmacotherapy and role as an adjunct in weight management.   Obesity Education Performed Today:  She was weighed on the bioimpedance scale and results were discussed and documented in the synopsis.  We discussed obesity as a disease and the importance of a more detailed evaluation of all the factors contributing to the disease.  We discussed the importance of long term lifestyle changes which include nutrition, exercise and behavioral modifications as well as the importance of customizing this to her specific health and social needs.  We discussed the benefits of reaching a healthier weight to alleviate the symptoms of existing conditions and reduce the risks of the biomechanical, metabolic and psychological effects of obesity.  We reviewed the four pillars of obesity medicine and importance of using a multimodal approach.  We reviewed the basic principles in weight management.   Chianne A Mcbroom appears to be in the action stage of change and states they are ready to start intensive lifestyle modifications and behavioral modifications.  I have spent 28 minutes in the care of the patient today including: 5 minutes before the visit reviewing and preparing the chart. 20 minutes face-to-face assessing and reviewing  listed medical problems as outlined in obesity care plan, providing nutritional and behavioral counseling on topics outlined in the obesity care plan, counseling regarding anti-obesity medication as outlined in obesity care plan, independently interpreting test results and goals of care, as described in assessment and plan, and reviewing and discussing biometric information and progress 3 minutes after the visit updating chart and documentation of encounter.  Reviewed by clinician on day of visit: allergies, medications, problem list, medical history, surgical history, family history, social history, and previous encounter notes pertinent to obesity diagnosis.  Vennessa Affinito d. Ulyses Panico, NP-C

## 2024-07-09 ENCOUNTER — Other Ambulatory Visit (HOSPITAL_COMMUNITY): Payer: Self-pay

## 2024-07-30 ENCOUNTER — Ambulatory Visit: Attending: Cardiology | Admitting: Cardiology

## 2024-07-30 ENCOUNTER — Encounter: Payer: Self-pay | Admitting: Cardiology

## 2024-07-30 ENCOUNTER — Other Ambulatory Visit (HOSPITAL_COMMUNITY): Payer: Self-pay

## 2024-07-30 VITALS — BP 128/84 | HR 61 | Ht 64.0 in | Wt 212.4 lb

## 2024-07-30 DIAGNOSIS — Z131 Encounter for screening for diabetes mellitus: Secondary | ICD-10-CM

## 2024-07-30 DIAGNOSIS — Z01812 Encounter for preprocedural laboratory examination: Secondary | ICD-10-CM | POA: Diagnosis not present

## 2024-07-30 DIAGNOSIS — R072 Precordial pain: Secondary | ICD-10-CM | POA: Diagnosis not present

## 2024-07-30 DIAGNOSIS — R030 Elevated blood-pressure reading, without diagnosis of hypertension: Secondary | ICD-10-CM | POA: Diagnosis not present

## 2024-07-30 MED ORDER — METOPROLOL TARTRATE 50 MG PO TABS
50.0000 mg | ORAL_TABLET | Freq: Once | ORAL | 0 refills | Status: AC
Start: 2024-07-30 — End: 2024-08-03
  Filled 2024-07-30: qty 1, 1d supply, fill #0

## 2024-07-30 NOTE — Patient Instructions (Addendum)
 Medication Instructions:  Your physician has recommended you make the following change in your medication:  STOP: Crestor  40 mg  Only take Crestor  20 mg once daily  *If you need a refill on your cardiac medications before your next appointment, please call your pharmacy*  Lab Work: CMET, Mag, HgbA1c If you have labs (blood work) drawn today and your tests are completely normal, you will receive your results only by: MyChart Message (if you have MyChart) OR A paper copy in the mail If you have any lab test that is abnormal or we need to change your treatment, we will call you to review the results.  Testing/Procedures:   Your cardiac CT will be scheduled at one of the below locations:   [facility] [address] [place], [place] [address] [phone] (Severe contrast allergies only)  OR  [facility] [address]  [place], [place] [address] [phone]  If scheduled at [facility], please arrive at the [facility] (Entrance C2) of [facility] 30 minutes prior to test start time. You can use the FREE valet parking offered at entrance C (encouraged to control the heart rate for the test)  Proceed to the [facility] Department (first floor) to check-in and test prep.  All radiology patients and guests should use entrance C2 at [facility], accessed from [facility], even though the hospital's physical address listed is [address]  If scheduled at the Heart and Vascular Tower at [address], please enter the parking lot using the [address] entrance and use the FREE valet service at the patient drop-off area. Enter the building and check-in with registration on the main floor.  Please follow these instructions carefully (unless otherwise directed):  An IV will be required for this test and Nitroglycerin will be given.   On the Night Before the  Test: Be sure to Drink plenty of water. Do not consume any caffeinated/decaffeinated beverages or chocolate 12 hours prior to your test. Do not take any antihistamines 12 hours prior to your test.  On the Day of the Test: Drink plenty of water until 1 hour prior to the test. Do not eat any food 1 hour prior to test. You may take your regular medications prior to the test.  Take metoprolol  (Lopressor ) two hours prior to test. If you take Furosemide/Hydrochlorothiazide/Spironolactone/Chlorthalidone, please HOLD on the morning of the test. Patients who wear a continuous glucose monitor MUST remove the device prior to scanning. FEMALES- please wear underwire-free bra if available, avoid dresses & tight clothing  After the Test: Drink plenty of water. After receiving IV contrast, you may experience a mild flushed feeling. This is normal. On occasion, you may experience a mild rash up to 24 hours after the test. This is not dangerous. If this occurs, you can take Benadryl 25 mg, Zyrtec, Claritin, or Allegra and increase your fluid intake. (Patients taking Tikosyn should avoid Benadryl, and may take Zyrtec, Claritin, or Allegra) If you experience trouble breathing, this can be serious. If it is severe call 911 IMMEDIATELY. If it is mild, please call our office.  We will call to schedule your test 2-4 weeks out understanding that some insurance companies will need an authorization prior to the service being performed.   For more information and frequently asked questions, please visit our website : [email or web address]  For non-scheduling related questions, please contact the cardiac imaging nurse navigator should  you have any questions/concerns: Cardiac Imaging Nurse Navigators Direct Office Dial: [phone]   For scheduling needs, including cancellations and rescheduling, please call [place], [phone].   Follow-Up: At [facility], you and your health needs are  our priority.  As part of our continuing mission to provide you with exceptional heart care, our providers are all part of one team.  This team includes your primary Cardiologist (physician) and Advanced Practice Providers or APPs (Physician Assistants and Nurse Practitioners) who all work together to provide you with the care you need, when you need it.  Your next appointment:   1 year(s)  Provider:   Kardie Tobb, DO

## 2024-07-30 NOTE — Progress Notes (Unsigned)
g

## 2024-07-30 NOTE — Progress Notes (Unsigned)
 Cardiology Office Note:    Date:  08/01/2024   ID:  Leslie Kirby, Leslie Kirby 1971-06-04, MRN [id number]  PCP:  Catalina Bare, MD  Cardiologist:  Dub Huntsman, DO  Electrophysiologist:  None   Referring MD: Catalina Bare, MD    I have been experiencing chest pain  History of Present Illness:    Leslie Kirby is a 53 y.o. female with a hx of hyperlipidemia, obesity and GERD.   She use to follow Dr. Burnard and her last visit was with [facility]. This is my first visit with her  Today she tells me that she has been experiencing intermittent chest discomfort.  This started a while back off-and-on.  But now it is getting worse.  She describes it as a midsternal pressure which starts and radiates to the back.  Sometimes is on exertion.  And is associated with significant shortness of breath.  Nothing makes it better or worse.  She is concerned   No past medical history on file.  Past Surgical History:  Procedure Laterality Date   NO PAST SURGERIES      Current Medications: Current Meds  Medication Sig   Cholecalciferol  (VITAMIN D3) 125 MCG (5000 UT) CAPS Take 1 capsule (5,000 Units total) by mouth daily.   metoprolol  tartrate (LOPRESSOR ) 50 MG tablet Take 1 tablet (50 mg total) by mouth 2 hours before CT scan.   rosuvastatin  (CRESTOR ) 20 MG tablet Take 1 tablet (20 mg total) by mouth daily.   [DISCONTINUED] rosuvastatin  (CRESTOR ) 40 MG tablet Take 1 tablet (40 mg total) by mouth daily.     Allergies:   Patient has no known allergies.   Social History   Socioeconomic History   Marital status: Married    Spouse name: Jayson   Number of children: 3   Years of education: college   Highest education level: Not on file  Occupational History    Employer: OTHER    Comment: [employer]  Tobacco Use   Smoking status: Never   Smokeless tobacco: Never  Substance and Sexual Activity   Alcohol use: No    Alcohol/week: 0.0 standard drinks of alcohol   Drug use:  No   Sexual activity: Not on file  Other Topics Concern   Not on file  Social History Narrative   Patient lives at home with family.   Caffeine Use: none   Social Drivers of Corporate investment banker Strain: Not on file  Food Insecurity: Not on file  Transportation Needs: Not on file  Physical Activity: Not on file  Stress: Not on file  Social Connections: Not on file     Family History: The patient's family history includes Memory loss in her mother.  ROS:   Review of Systems  Constitution: Negative for decreased appetite, fever and weight gain.  HENT: Negative for congestion, ear discharge, hoarse voice and sore throat.   Eyes: Negative for discharge, redness, vision loss in right eye and visual halos.  Cardiovascular: Negative for chest pain, dyspnea on exertion, leg swelling, orthopnea and palpitations.  Respiratory: Negative for cough, hemoptysis, shortness of breath and snoring.   Endocrine: Negative for heat intolerance and polyphagia.  Hematologic/Lymphatic: Negative for bleeding problem. Does not bruise/bleed easily.  Skin: Negative for flushing, nail changes, rash and suspicious lesions.  Musculoskeletal: Negative for arthritis, joint pain, muscle cramps, myalgias, neck pain and stiffness.  Gastrointestinal: Negative for abdominal pain, bowel incontinence, diarrhea and excessive appetite.  Genitourinary: Negative for decreased libido, genital sores  and incomplete emptying.  Neurological: Negative for brief paralysis, focal weakness, headaches and loss of balance.  Psychiatric/Behavioral: Negative for altered mental status, depression and suicidal ideas.  Allergic/Immunologic: Negative for HIV exposure and persistent infections.    EKGs/Labs/Other Studies Reviewed:    The following studies were reviewed today:   EKG:  The ekg ordered today demonstrates   Recent Labs: 10/17/2023: Hemoglobin 13.0; Platelets 354; TSH 1.790 07/30/2024: ALT 16; BUN 12; Creatinine,  Ser 0.60; Magnesium 2.0; Potassium 4.1; Sodium 140  Recent Lipid Panel    Component Value Date/Time   CHOL 108 03/26/2024 1042   TRIG 103 03/26/2024 1042   HDL 39 (L) 03/26/2024 1042   CHOLHDL 2.8 03/26/2024 1042   LDLCALC 50 03/26/2024 1042    Physical Exam:    VS:  BP 128/84 (BP Location: Right Arm, Patient Position: Sitting, Cuff Size: Large)   Pulse 61   Ht 5' 4 (1.626 m)   Wt 212 lb 6.4 oz (96.3 kg)   SpO2 95%   BMI 36.46 kg/m     Wt Readings from Last 3 Encounters:  07/30/24 212 lb 6.4 oz (96.3 kg)  06/17/24 209 lb (94.8 kg)  03/26/24 211 lb (95.7 kg)     GEN: Well nourished, well developed in no acute distress HEENT: Normal NECK: No JVD; No carotid bruits LYMPHATICS: No lymphadenopathy CARDIAC: S1S2 noted,RRR, no murmurs, rubs, gallops RESPIRATORY:  Clear to auscultation without rales, wheezing or rhonchi  ABDOMEN: Soft, non-tender, non-distended, +bowel sounds, no guarding. EXTREMITIES: No edema, No cyanosis, no clubbing MUSCULOSKELETAL:  No deformity  SKIN: Warm and dry NEUROLOGIC:  Alert and oriented x 3, non-focal PSYCHIATRIC:  Normal affect, good insight  ASSESSMENT:    1. Elevated blood pressure reading   2. Precordial pain   3. Screening for diabetes mellitus (DM)   4. Pre-procedure lab exam    PLAN:    The symptoms chest pain is concerning, this patient does have intermediate risk for coronary artery disease and at this time I would like to pursue an ischemic evaluation in this patient.  Shared decision a coronary CTA at this time is appropriate.  I have discussed with the patient about the testing.  The patient has no IV contrast allergy and is agreeable to proceed with this test.  Of note she has been taking 60 mg of Crestor : She was ordered 40 mg by her previous cardiologist Dr. Burnard and then her PCP recently decreased this to 20 mg unfortunately this was not made clear to the patient and she has been taking 60 mg of Crestor .  Will get blood  work today to look at [facility],    The patient is in agreement with the above plan. The patient left the office in stable condition.  The patient will follow up in   Medication Adjustments/Labs and Tests Ordered: Current medicines are reviewed at length with the patient today.  Concerns regarding medicines are outlined above.  Orders Placed This Encounter  Procedures   CT CORONARY MORPH W/CTA COR W/SCORE W/CA W/CM &/OR WO/CM   Comp Met (CMET)   Magnesium   Hemoglobin A1c   EKG 12-Lead   Meds ordered this encounter  Medications   metoprolol  tartrate (LOPRESSOR ) 50 MG tablet    Sig: Take 1 tablet (50 mg total) by mouth 2 hours before CT scan.    Dispense:  1 tablet    Refill:  0    Patient Instructions  Medication Instructions:  Your physician has recommended you make  the following change in your medication:  STOP: Crestor  40 mg  Only take Crestor  20 mg once daily  *If you need a refill on your cardiac medications before your next appointment, please call your pharmacy*  Lab Work: CMET, Mag, HgbA1c If you have labs (blood work) drawn today and your tests are completely normal, you will receive your results only by: MyChart Message (if you have MyChart) OR A paper copy in the mail If you have any lab test that is abnormal or we need to change your treatment, we will call you to review the results.  Testing/Procedures:   Your cardiac CT will be scheduled at one of the below locations:   [facility] [address] [place], [place] [address] [phone] (Severe contrast allergies only)  OR  [facility] [address]  [place], [place] [address] [phone]  If scheduled at [facility], please arrive at the [facility] and Children's Entrance (Entrance C2) of [facility] 30 minutes prior to test start time. You can use the FREE valet parking offered at entrance C (encouraged to control the heart rate for the test)   Proceed to the [facility] Radiology Department (first floor) to check-in and test prep.  All radiology patients and guests should use entrance C2 at [facility], accessed from [facility], even though the hospital's physical address listed is [address]  If scheduled at the Heart and Vascular Tower at [address], please enter the parking lot using the [address] entrance and use the FREE valet service at the patient drop-off area. Enter the building and check-in with registration on the main floor.  Please follow these instructions carefully (unless otherwise directed):  An IV will be required for this test and Nitroglycerin will be given.   On the Night Before the Test: Be sure to Drink plenty of water. Do not consume any caffeinated/decaffeinated beverages or chocolate 12 hours prior to your test. Do not take any antihistamines 12 hours prior to your test.  On the Day of the Test: Drink plenty of water until 1 hour prior to the test. Do not eat any food 1 hour prior to test. You may take your regular medications prior to the test.  Take metoprolol  (Lopressor ) two hours prior to test. If you take Furosemide/Hydrochlorothiazide/Spironolactone/Chlorthalidone, please HOLD on the morning of the test. Patients who wear a continuous glucose monitor MUST remove the device prior to scanning. FEMALES- please wear underwire-free bra if available, avoid dresses & tight clothing  After the Test: Drink plenty of water. After receiving IV contrast, you may experience a mild flushed feeling. This is normal. On occasion, you may experience a mild rash up to 24 hours after the test. This is not dangerous. If this occurs, you can take Benadryl 25 mg, Zyrtec, Claritin, or Allegra and increase your fluid intake. (Patients taking Tikosyn should avoid Benadryl, and may take Zyrtec, Claritin, or Allegra) If you experience trouble breathing, this can be serious. If  it is severe call 911 IMMEDIATELY. If it is mild, please call our office.  We will call to schedule your test 2-4 weeks out understanding that some insurance companies will need an authorization prior to the service being performed.   For more information and frequently asked questions, please visit our website : [email or web address]  For non-scheduling related questions, please contact the cardiac imaging nurse navigator should you have any questions/concerns: Cardiac Imaging Nurse Navigators  Direct Office Dial: [phone]   For scheduling needs, including cancellations and rescheduling, please call [place], [phone].   Follow-Up: At [facility], you and your health needs are our priority.  As part of our continuing mission to provide you with exceptional heart care, our providers are all part of one team.  This team includes your primary Cardiologist (physician) and Advanced Practice Providers or APPs (Physician Assistants and Nurse Practitioners) who all work together to provide you with the care you need, when you need it.  Your next appointment:   1 year(s)  Provider:   Gwenevere Goga, DO             Adopting a Healthy Lifestyle.  Know what a healthy weight is for you (roughly BMI <25) and aim to maintain this   Aim for 7+ servings of fruits and vegetables daily   65-80+ fluid ounces of water or unsweet tea for healthy kidneys   Limit to max 1 drink of alcohol per day; avoid smoking/tobacco   Limit animal fats in diet for cholesterol and heart health - choose grass fed whenever available   Avoid highly processed foods, and foods high in saturated/trans fats   Aim for low stress - take time to unwind and care for your mental health   Aim for 150 min of moderate intensity exercise weekly for heart health, and weights twice weekly for bone health   Aim for 7-9 hours of sleep daily   When it comes to diets, agreement about the perfect plan  isnt easy to find, even among the experts. Experts at the [facility] [employer] developed an idea known as the Healthy Eating Plate. Just imagine a plate divided into logical, healthy portions.   The emphasis is on diet quality:   Load up on vegetables and fruits - one-half of your plate: Aim for color and variety, and remember that potatoes dont count.   Go for whole grains - one-quarter of your plate: Whole wheat, barley, wheat berries, quinoa, oats, brown rice, and foods made with them. If you want pasta, go with whole wheat pasta.   Protein power - one-quarter of your plate: Fish, chicken, beans, and nuts are all healthy, versatile protein sources. Limit red meat.   The diet, however, does go beyond the plate, offering a few other suggestions.   Use healthy plant oils, such as olive, canola, soy, corn, sunflower and peanut. Check the labels, and avoid partially hydrogenated oil, which have unhealthy trans fats.   If youre thirsty, drink water. Coffee and tea are good in moderation, but skip sugary drinks and limit milk and dairy products to one or two daily servings.   The type of carbohydrate in the diet is more important than the amount. Some sources of carbohydrates, such as vegetables, fruits, whole grains, and beans-are healthier than others.   Finally, stay active  Signed, Dub Huntsman, DO  08/01/2024 5:59 PM    [facility]

## 2024-07-31 LAB — COMPREHENSIVE METABOLIC PANEL WITH GFR
ALT: 16 IU/L (ref 0–32)
AST: 23 IU/L (ref 0–40)
Albumin: 4.1 g/dL (ref 3.8–4.9)
Alkaline Phosphatase: 106 IU/L (ref 44–121)
BUN/Creatinine Ratio: 20 (ref 9–23)
BUN: 12 mg/dL (ref 6–24)
Bilirubin Total: 0.3 mg/dL (ref 0.0–1.2)
CO2: 19 mmol/L — AB (ref 20–29)
Calcium: 9.6 mg/dL (ref 8.7–10.2)
Chloride: 105 mmol/L (ref 96–106)
Creatinine, Ser: 0.6 mg/dL (ref 0.57–1.00)
Globulin, Total: 2.8 g/dL (ref 1.5–4.5)
Glucose: 84 mg/dL (ref 70–99)
Potassium: 4.1 mmol/L (ref 3.5–5.2)
Sodium: 140 mmol/L (ref 134–144)
Total Protein: 6.9 g/dL (ref 6.0–8.5)
eGFR: 107 mL/min/1.73 (ref >59)

## 2024-07-31 LAB — MAGNESIUM: Magnesium: 2 mg/dL (ref 1.6–2.3)

## 2024-07-31 LAB — HEMOGLOBIN A1C
Est. average glucose Bld gHb Est-mCnc: 100 mg/dL
Hgb A1c MFr Bld: 5.1 (ref 4.8–5.6)

## 2024-08-02 ENCOUNTER — Encounter (HOSPITAL_COMMUNITY): Payer: Self-pay | Admitting: Emergency Medicine

## 2024-08-02 ENCOUNTER — Encounter (HOSPITAL_COMMUNITY): Payer: Self-pay

## 2024-08-02 ENCOUNTER — Other Ambulatory Visit (HOSPITAL_COMMUNITY): Payer: Self-pay

## 2024-08-03 ENCOUNTER — Ambulatory Visit: Payer: Self-pay | Admitting: Cardiology

## 2024-08-04 ENCOUNTER — Ambulatory Visit (HOSPITAL_COMMUNITY)
Admission: RE | Admit: 2024-08-04 | Discharge: 2024-08-04 | Disposition: A | Discharge status: Home / Self Care | Source: Ambulatory Visit | Attending: Cardiology | Admitting: Cardiology

## 2024-08-04 DIAGNOSIS — R072 Precordial pain: Secondary | ICD-10-CM | POA: Diagnosis not present

## 2024-08-04 DIAGNOSIS — R079 Chest pain, unspecified: Secondary | ICD-10-CM | POA: Insufficient documentation

## 2024-08-04 MED ORDER — IOHEXOL 350 MG/ML SOLN
100.0000 mL | Freq: Once | INTRAVENOUS | Status: AC | PRN
  Administered 2024-08-04: 100 mL via INTRAVENOUS

## 2024-08-04 MED ORDER — NITROGLYCERIN 0.4 MG SL SUBL
0.8000 mg | SUBLINGUAL_TABLET | Freq: Once | SUBLINGUAL | Status: AC
  Administered 2024-08-04: 0.8 mg via SUBLINGUAL

## 2024-08-18 ENCOUNTER — Ambulatory Visit (INDEPENDENT_AMBULATORY_CARE_PROVIDER_SITE_OTHER): Admitting: Family Medicine

## 2024-09-01 ENCOUNTER — Ambulatory Visit (INDEPENDENT_AMBULATORY_CARE_PROVIDER_SITE_OTHER): Admitting: Family Medicine

## 2024-09-02 ENCOUNTER — Other Ambulatory Visit (HOSPITAL_COMMUNITY): Payer: Self-pay

## 2024-09-02 ENCOUNTER — Other Ambulatory Visit: Payer: Self-pay | Admitting: Cardiology

## 2024-09-03 ENCOUNTER — Other Ambulatory Visit (HOSPITAL_COMMUNITY): Payer: Self-pay

## 2024-09-03 MED ORDER — METOPROLOL TARTRATE 50 MG PO TABS
50.0000 mg | ORAL_TABLET | Freq: Once | ORAL | 0 refills | Status: AC
  Filled 2024-09-03: qty 1, 1d supply, fill #0

## 2024-09-07 ENCOUNTER — Other Ambulatory Visit (HOSPITAL_COMMUNITY): Payer: Self-pay

## 2024-09-07 MED ORDER — ATOVAQUONE-PROGUANIL HCL 250-100 MG PO TABS
1.0000 | ORAL_TABLET | Freq: Every day | ORAL | 0 refills | Status: AC
  Filled 2024-09-07: qty 37, 37d supply, fill #0

## 2024-09-07 MED ORDER — AZITHROMYCIN 500 MG PO TABS
ORAL_TABLET | ORAL | 0 refills | Status: AC
  Filled 2024-09-07: qty 4, 3d supply, fill #0

## 2024-09-08 ENCOUNTER — Other Ambulatory Visit (HOSPITAL_COMMUNITY): Payer: Self-pay

## 2024-09-09 ENCOUNTER — Other Ambulatory Visit (HOSPITAL_COMMUNITY): Payer: Self-pay

## 2024-09-10 ENCOUNTER — Other Ambulatory Visit (HOSPITAL_COMMUNITY): Payer: Self-pay

## 2024-09-10 MED ORDER — COVID-19 MRNA VAC-TRIS(PFIZER) 30 MCG/0.3ML IM SUSY
0.3000 mL | PREFILLED_SYRINGE | Freq: Once | INTRAMUSCULAR | 0 refills | Status: AC
  Filled 2024-09-10: qty 0.3, 1d supply, fill #0

## 2024-09-30 ENCOUNTER — Other Ambulatory Visit (HOSPITAL_COMMUNITY): Payer: Self-pay

## 2024-12-08 ENCOUNTER — Encounter (INDEPENDENT_AMBULATORY_CARE_PROVIDER_SITE_OTHER): Payer: Self-pay

## 2025-01-05 ENCOUNTER — Ambulatory Visit (INDEPENDENT_AMBULATORY_CARE_PROVIDER_SITE_OTHER): Admitting: Family Medicine

## 2025-01-19 ENCOUNTER — Ambulatory Visit (INDEPENDENT_AMBULATORY_CARE_PROVIDER_SITE_OTHER): Admitting: Family Medicine
